# Patient Record
Sex: Female | Born: 1935 | Race: Black or African American | Hispanic: No | Smoking: Never smoker
Health system: Southern US, Community
[De-identification: ages and names within clinical notes are randomized; demographics above are authoritative.]

## PROBLEM LIST (undated history)

## (undated) DIAGNOSIS — E0590 Thyrotoxicosis, unspecified without thyrotoxic crisis or storm: Secondary | ICD-10-CM

## (undated) DIAGNOSIS — F0280 Dementia in other diseases classified elsewhere without behavioral disturbance: Secondary | ICD-10-CM

## (undated) DIAGNOSIS — G309 Alzheimer's disease, unspecified: Secondary | ICD-10-CM

## (undated) DIAGNOSIS — I10 Essential (primary) hypertension: Secondary | ICD-10-CM

---

## 2007-01-27 ENCOUNTER — Inpatient Hospital Stay (HOSPITAL_COMMUNITY): Admission: EM | Admit: 2007-01-27 | Discharge: 2007-01-30 | Payer: Self-pay | Admitting: Emergency Medicine

## 2007-01-27 ENCOUNTER — Encounter: Payer: Self-pay | Admitting: *Deleted

## 2008-07-14 ENCOUNTER — Emergency Department (HOSPITAL_COMMUNITY): Admission: EM | Admit: 2008-07-14 | Discharge: 2008-07-14 | Payer: Self-pay | Admitting: Emergency Medicine

## 2011-01-07 NOTE — Procedures (Signed)
EEG NUMBER:  [id number].   HISTORY:  This is an 77 year old patient who is being evaluated for  episodes of passing out, falling, decreased memory, the patient being  evaluated for possible seizures.  This is a routine EEG.  No skull  defects were noted.  Medications include Lovenox, Synthroid, glipizide  amlodipine.   EEG CLASSIFICATION:  Normal awake and drowsy.   DESCRIPTION OF THE RECORDING:  The background rhythm of this recording  consists of a fairly well-modulated medium-amplitude alpha rhythm of 10  Hz that is reactive to eye opening and closure.  As the record begins,  the patient appears to be in the drowsy state with a 7 Hz background  slowing.  The patient never appears to clearly enter stage II sleep.  The patient spends much of the recording in what appears to be a drowsy  state but is alerted towards the end of the recording in appears to have  a very well-modulated 10 Hz background activity.  Photic stimulation is  performed resulting in a bilateral and symmetric photic drive response.  Hyperventilation was not performed.  At no time during the recording  does there appear to be evidence of spikes, spike wave discharges, ,or  evidence of focal slowing.  The EKG monitor shows evidence of occasional  premature ventricular complexes with a heart rate of 78.   IMPRESSION:  This is an essentially normal EEG recording, awake and  drowsy state.  No evidence of ictal or interictal discharges was seen.      Marlan Palau, M.D.  Electronically Signed     [id number]  D:  01/29/2007 17:15:24  T:  01/30/2007 07:15:58  Job #:  [id number]

## 2011-01-10 NOTE — Discharge Summary (Signed)
NAME:  Michele Ward, FEIN NO.:  [id number]   MEDICAL RECORD NO.:  [id number]          PATIENT TYPE:  INP   LOCATION:  2035                         FACILITY:  [facility]   PHYSICIAN:  Margaretmary Bayley, M.D.    DATE OF BIRTH:  [birth year]   DATE OF ADMISSION:  01/27/2007  DATE OF DISCHARGE:  01/30/2007                               DISCHARGE SUMMARY   DISCHARGE DIAGNOSES:  1. Syncope.  2. Primary hyperparathyroidism.  3. Alcohol intoxication.  4. Contusions of the head, chest and posttraumatic headache.  5. Type 2 diabetes mellitus.  6. Lumbar spinal stenosis.   REASON FOR ADMISSION:  Michele Ward is an 77 year old hypertensive  type 2 diabetic who was brought into the emergency room after being  found by her son lying on the floor with an estimated period of time of  16 to 24 hours.  Patient was brought in through the emergency room where  she complained of some headache, lower back and right hip pain.  She was  evaluated in the ER, found to have no acute fractures, but did have  reduction in her ability to walk and was admitted for further  observation.   PERTINENT PHYSICAL FINDINGS:  GENERAL:  She is a well-developed-  appearing woman looking about her stated age.  There was no acute  distress noted.  LUNGS:  Her respirations were unlabored.  VITAL SIGNS:  Her admission blood pressure was 110/82 with pulse rate of  76 in a lying position and sitting with her legs dangling blood pressure  was 107/75 with a pulse rate of 80, respiratory rate of 20, temperature  is 97.  SKIN:  Warm and dry.  No gross ecchymoses was noted.  NECK:  Range of motion of her neck is full.  Movement of her extremities  are likewise full.  HEENT:  Remarkable for a negative Battle sign, negative racoon sign,  some broader evidence of head trauma.  Sclera is anicteric.  No  conjunctival pallor.  CHEST:  She has mild right rib tenderness, but no ecchymoses was noted.  LUNGS:  Clear to  percussion and auscultation.  CARDIAC EXAM:  Rhythm was basically regular with a few ventral systoles.  No gallops or rubs.  ABDOMEN:  Is not distended.  Soft.  No tenderness.  EXTREMITIES:  No clubbing, cyanosis or edema.  No calf tenderness.  Negative Homans sign.  BACK:  There is no punch tenderness over the spine.  No CVA tenderness.  NEUROLOGICALLY:  She was alert, oriented to name and place.   PERTINENT LAB AND X-RAY DATA:  Her EKG revealed a normal sinus rhythm,  no deviation of the electrical axis, nonspecific repolarization  abnormality.   White blood cell count of 10,100, sed rate 14, INR 1.0, PTT of 22.  CPEP  is remarkable for a potassium low at 3.3, sodium 143, glucose 132,  creatinine 0.73, calcium 2.6, free T4 is normal at 1.21, TSH 0.774 and a  B12 level that is low at 126.   Chest x-ray showed no acute process.   CT of the brain scan likewise showed no acute traumatic or nontraumatic  pathology.   HOSPITAL COURSE:  The patient was admitted to a telemetry bed where  there was no evidence found of headache, suggestion of a significant  arrhythmia.  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  In addition to parenteral  replacement with vitamin B12, patient was given a loading and  maintenance dose of thiamine.   In addition to her negative CT brain scan, an EEG was requested and a  verbal report prior to her discharge indicated that there was no  evidence suggestive of a seizure focus.  In addition to her thiamine and  B12 replacement, potassium replacement therapy, she was also continued  on levothyroxine at 50 mcg per day.  Although her urinalysis was  consistent with a cystitis, her subsequent urine cultures revealed a  variable amount of bacteria, but most consistent with an improper  collection.  A repeat urine culture will be obtained as an outpatient.   With the assistance of the physical therapy  division, the patient was  gradually ambulated and showed fairly good balance, but it was  recommended that she continue to use a walker as her physical therapy is  transitioned from inpatient to outpatient.  However, no significant  medical problems were noted during hospitalization, her blood sugars  remained quite good, and there was no evidence to suggest that  hypoglycemia played a role in her syncopal episode.   DISCHARGE CONDITION:  Is considered to be fair.   She continued on:  1. Synthroid at 50 mcg per day  2. Glipizide 2.5 mg daily.   Antihypertensive medication included:  1. Amlodipine 5 mg per day.  2. Valsartan 160 mg per day.  3. Ecotrin at 81 mg daily.  4. Ciprofloxacin at 500 mg b.i.d. for six days pending the completion      of a repeat urine culture.   She is discharged on a 3-gm sodium, no-concentrated sweets, modified and  restricted diet, and she is scheduled to be seen in followup in two  weeks.           ______________________________  Margaretmary Bayley, M.D.     PC/MEDQ  D:  03/18/2007  T:  03/18/2007  Job:  [id number]

## 2011-05-27 LAB — URINALYSIS, ROUTINE W REFLEX MICROSCOPIC
Bilirubin Urine: NEGATIVE
Glucose, UA: NEGATIVE
Nitrite: NEGATIVE
Protein, ur: NEGATIVE
Urobilinogen, UA: 0.2
pH: 6.5

## 2011-05-27 LAB — POCT I-STAT, CHEM 8
Chloride: 108
HCT: 34 — ABNORMAL LOW
Hemoglobin: 11.6 — ABNORMAL LOW
Potassium: 3.8
Sodium: 140
TCO2: 22

## 2011-05-27 LAB — URINE MICROSCOPIC-ADD ON

## 2011-05-27 LAB — CBC
HCT: 33.5 — ABNORMAL LOW
Hemoglobin: 11.3 — ABNORMAL LOW
MCV: 94.6
WBC: 4.6

## 2011-05-27 LAB — DIFFERENTIAL
Basophils Relative: 0
Monocytes Relative: 14 — ABNORMAL HIGH
Neutro Abs: 2.6

## 2011-05-27 LAB — GLUCOSE, CAPILLARY: Glucose-Capillary: 78

## 2011-06-12 LAB — COMPREHENSIVE METABOLIC PANEL
Alkaline Phosphatase: 79
CO2: 25
Chloride: 107
Creatinine, Ser: 0.73
GFR calc non Af Amer: >60
Potassium: 3.5
Sodium: 143
Total Bilirubin: 1.1

## 2011-06-12 LAB — BASIC METABOLIC PANEL
BUN: 18
BUN: 20
CO2: 26
CO2: 27
Calcium: 10
Chloride: 111
Creatinine, Ser: 0.78
Creatinine, Ser: 0.83
GFR calc Af Amer: >60
GFR calc non Af Amer: >60
Glucose, Bld: 128 — ABNORMAL HIGH
Sodium: 138

## 2011-06-12 LAB — CBC
Hemoglobin: 12.1
MCHC: 33.5
MCHC: 33.8
MCHC: 34.2
MCV: 91.3
MCV: 91.4
Platelets: 241
RBC: 3.83 — ABNORMAL LOW
RBC: 4.14
RBC: 4.56
RDW: 14.1 — ABNORMAL HIGH
WBC: 10.9 — ABNORMAL HIGH
WBC: 10.9 — ABNORMAL HIGH

## 2011-06-12 LAB — DIFFERENTIAL
Lymphs Abs: 1
Monocytes Absolute: 0.8 — ABNORMAL HIGH
Monocytes Relative: 7
Neutrophils Relative %: 83 — ABNORMAL HIGH

## 2011-06-12 LAB — RPR TITER: RPR Titer: 1:1 {titer}

## 2011-06-12 LAB — RPR: RPR Ser Ql: REACTIVE — AB

## 2011-06-12 LAB — ANTI-PARIETAL ANTIBODY: Parietal Cell Antibody-IgG: 1:80 {titer} — AB

## 2011-06-12 LAB — URINALYSIS, ROUTINE W REFLEX MICROSCOPIC
Glucose, UA: NEGATIVE
Nitrite: NEGATIVE
Urobilinogen, UA: 0.2

## 2011-06-12 LAB — T4, FREE: Free T4: 1.21

## 2011-06-12 LAB — URINALYSIS, MICROSCOPIC ONLY
Bilirubin Urine: NEGATIVE
Glucose, UA: NEGATIVE
Hgb urine dipstick: NEGATIVE
Ketones, ur: NEGATIVE
Nitrite: NEGATIVE
Specific Gravity, Urine: 1.016 (ref 1.005–1.035)
pH: 5.5

## 2011-06-12 LAB — URINE CULTURE: Special Requests: POSITIVE

## 2011-06-12 LAB — VITAMIN B12: Vitamin B-12: 126 — ABNORMAL LOW (ref 211–911)

## 2011-06-12 LAB — POCT I-STAT CREATININE: Operator id: 208821

## 2011-06-12 LAB — I-STAT 8, (EC8 V) (CONVERTED LAB)
BUN: 11
Bicarbonate: 25.6 — ABNORMAL HIGH
Chloride: 109
Glucose, Bld: 132 — ABNORMAL HIGH
Operator id: 208821
Potassium: 3.3 — ABNORMAL LOW

## 2011-06-12 LAB — TSH: TSH: 0.774

## 2011-06-12 LAB — SEDIMENTATION RATE: Sed Rate: 14

## 2011-06-12 LAB — PROTIME-INR
INR: 1
Prothrombin Time: 13.7

## 2011-06-12 LAB — URINE MICROSCOPIC-ADD ON

## 2011-06-12 LAB — APTT: aPTT: 22 — ABNORMAL LOW

## 2011-06-20 LAB — TPPA: Treponema Confirm: REACTIVE — AB

## 2011-08-05 ENCOUNTER — Emergency Department (HOSPITAL_COMMUNITY)
Admission: EM | Admit: 2011-08-05 | Discharge: 2011-08-05 | Disposition: A | Discharge status: Home / Self Care | Payer: Medicare Other | Attending: Emergency Medicine | Admitting: Emergency Medicine

## 2011-08-05 DIAGNOSIS — Z79899 Other long term (current) drug therapy: Secondary | ICD-10-CM | POA: Insufficient documentation

## 2011-08-05 DIAGNOSIS — F0390 Unspecified dementia without behavioral disturbance: Secondary | ICD-10-CM | POA: Insufficient documentation

## 2011-08-05 DIAGNOSIS — F515 Nightmare disorder: Secondary | ICD-10-CM

## 2011-08-05 DIAGNOSIS — IMO0002 Reserved for concepts with insufficient information to code with codable children: Secondary | ICD-10-CM | POA: Insufficient documentation

## 2011-08-05 DIAGNOSIS — R4182 Altered mental status, unspecified: Secondary | ICD-10-CM | POA: Insufficient documentation

## 2011-08-05 LAB — URINALYSIS, ROUTINE W REFLEX MICROSCOPIC
Bilirubin Urine: NEGATIVE
Glucose, UA: NEGATIVE mg/dL
Ketones, ur: NEGATIVE mg/dL
Nitrite: NEGATIVE
pH: 7.5 (ref 5.0–8.0)

## 2011-08-05 LAB — URINE MICROSCOPIC-ADD ON

## 2011-08-05 NOTE — ED Notes (Signed)
Patient very pleasant but orientated only to self.  Patient unable to answer any questions regarding age, living conditions  Or who takes care of her.

## 2011-08-05 NOTE — ED Notes (Signed)
PATIENT daughter calls again asking if the patient is stabilized.  Told the daughter that the patient has never been unstable since her arrival. patient has remained quiet and cooperative .  Dr Jerelyn Charles talks with the daughter and will d/c to home via [facility]

## 2011-08-05 NOTE — ED Notes (Signed)
Daughter called and talked to the tech.  Daughter told tech that she was coming to the hospital

## 2011-08-05 NOTE — ED Notes (Signed)
Per EMS patient family sts that the patient has "never" acted like this before.  Patient has Hx of altzheimers ds x 5years.  Family sts that the patient needs to be stablized.

## 2011-08-05 NOTE — ED Notes (Signed)
[facility] here to pick up patient . D/c'd home

## 2011-08-05 NOTE — ED Provider Notes (Signed)
History     CSN: [id number] Arrival date & time: 08/05/2011  3:10 AM   First MD Initiated Contact with Patient 08/05/11 [phone]      Chief Complaint  Patient presents with  . Altered Mental Status    (Consider location/radiation/quality/duration/timing/severity/associated sxs/prior treatment) Patient is a 77 y.o. female presenting with altered mental status. The history is provided by the EMS personnel. History Limited By: Dementia.  Altered Mental Status   the patient was brought from home where she stays with her family by EMS.  EMS reports the family called because the patient was acting out at home.  As to the details of this I have no further information.  The patient has a history of dementia and has so for 5 years.  She is on Namenda.  The patient is calm and cooperative in the emergency room and has no complaints.  The only contact number for the family in the computer is not active.  My staff and has made that the family has colds and states they will be in the emergency department shortly for which I hope to gain additional information.  No past medical history on file.  No past surgical history on file.  No family history on file.  History  Substance Use Topics  . Smoking status: Not on file  . Smokeless tobacco: Not on file  . Alcohol Use: Not on file    OB History    No data available      Review of Systems  Unable to perform ROS Psychiatric/Behavioral: Positive for altered mental status.    Allergies  Review of patient's allergies indicates not on file.  Home Medications   Current Outpatient Rx  Name Route Sig Dispense Refill  . AMLODIPINE BESY-BENAZEPRIL HCL 10-20 MG PO CAPS Oral Take 1 capsule by mouth daily.      . ASPIRIN 81 MG PO CHEW Oral Chew 81 mg by mouth daily.      . BUSPIRONE HCL 15 MG PO TABS Oral Take 15 mg by mouth 2 (two) times daily.      Marland Kitchen CALTRATE 600+D PLUS 600-400 MG-UNIT PO TABS Oral Take 1 tablet by mouth daily.      Marland Kitchen  LEVOTHYROXINE SODIUM 50 MCG PO TABS Oral Take 50 mcg by mouth daily.      Marland Kitchen MEMANTINE HCL 10 MG PO TABS Oral Take 10 mg by mouth 2 (two) times daily. Takes 1 tablet in the morning and 5mg  every evening     . NATEGLINIDE 120 MG PO TABS Oral Take 120 mg by mouth daily.        BP 131/67  Pulse 111  Temp(Src) 97.9 F (36.6 C) (Oral)  Resp 20  SpO2 95%  Physical Exam  Nursing note and vitals reviewed. Constitutional: She appears well-developed and well-nourished. No distress.  HENT:  Head: Normocephalic and atraumatic.  Eyes: EOM are normal.  Neck: Normal range of motion.  Cardiovascular: Normal rate, regular rhythm and normal heart sounds.   Pulmonary/Chest: Effort normal and breath sounds normal.  Abdominal: Soft. She exhibits no distension. There is no tenderness.  Musculoskeletal: Normal range of motion.  Neurological: She is alert.  Skin: Skin is warm and dry.  Psychiatric: She has a normal mood and affect. Judgment normal.    ED Course  Procedures (including critical care time)   Labs Reviewed  CBC  BASIC METABOLIC PANEL  URINALYSIS, ROUTINE W REFLEX MICROSCOPIC   No results found.   1. Nightmare  MDM  At this time we'll obtain basic blood work and a urine sample and await the family's arrival for additional information to help direct her care in the emergency department today   5:36 AM The family has called and reports that the patient awoke screaming in her bed yelling that she was following this disturbed the family and assisted her to the ER for evaluation.  I suspect the patient had a nightmare.  The family agrees that also thinks he had a nightmare but they said they were concerned and wanted are out input and evaluation  Pt will be discharged back home  Lyanne Co, MD 08/05/11 [phone]

## 2011-12-02 ENCOUNTER — Encounter (HOSPITAL_COMMUNITY): Payer: Self-pay | Admitting: *Deleted

## 2011-12-02 ENCOUNTER — Emergency Department (HOSPITAL_COMMUNITY): Payer: Medicare Other

## 2011-12-02 ENCOUNTER — Emergency Department (HOSPITAL_COMMUNITY)
Admission: EM | Admit: 2011-12-02 | Discharge: 2011-12-02 | Disposition: A | Discharge status: Home / Self Care | Payer: Medicare Other | Attending: Emergency Medicine | Admitting: Emergency Medicine

## 2011-12-02 DIAGNOSIS — E039 Hypothyroidism, unspecified: Secondary | ICD-10-CM | POA: Insufficient documentation

## 2011-12-02 DIAGNOSIS — Z79899 Other long term (current) drug therapy: Secondary | ICD-10-CM | POA: Insufficient documentation

## 2011-12-02 DIAGNOSIS — E119 Type 2 diabetes mellitus without complications: Secondary | ICD-10-CM | POA: Insufficient documentation

## 2011-12-02 DIAGNOSIS — G309 Alzheimer's disease, unspecified: Secondary | ICD-10-CM | POA: Insufficient documentation

## 2011-12-02 DIAGNOSIS — I10 Essential (primary) hypertension: Secondary | ICD-10-CM | POA: Insufficient documentation

## 2011-12-02 DIAGNOSIS — F0280 Dementia in other diseases classified elsewhere without behavioral disturbance: Secondary | ICD-10-CM | POA: Insufficient documentation

## 2011-12-02 DIAGNOSIS — R634 Abnormal weight loss: Secondary | ICD-10-CM | POA: Insufficient documentation

## 2011-12-02 DIAGNOSIS — Z7901 Long term (current) use of anticoagulants: Secondary | ICD-10-CM | POA: Insufficient documentation

## 2011-12-02 HISTORY — DX: Thyrotoxicosis, unspecified without thyrotoxic crisis or storm: E05.90

## 2011-12-02 HISTORY — DX: Alzheimer's disease, unspecified: G30.9

## 2011-12-02 HISTORY — DX: Dementia in other diseases classified elsewhere, unspecified severity, without behavioral disturbance, psychotic disturbance, mood disturbance, and anxiety: F02.80

## 2011-12-02 HISTORY — DX: Essential (primary) hypertension: I10

## 2011-12-02 LAB — COMPREHENSIVE METABOLIC PANEL
ALT: 18 U/L (ref 0–35)
Alkaline Phosphatase: 123 U/L — ABNORMAL HIGH (ref 39–117)
BUN: 9 mg/dL (ref 6–23)
Chloride: 103 mEq/L (ref 96–112)
GFR calc Af Amer: >90 mL/min (ref >90)
Glucose, Bld: 166 mg/dL — ABNORMAL HIGH (ref 70–99)
Potassium: 3.7 mEq/L (ref 3.5–5.1)
Total Bilirubin: 0.3 mg/dL (ref 0.3–1.2)

## 2011-12-02 LAB — URINALYSIS, ROUTINE W REFLEX MICROSCOPIC
Bilirubin Urine: NEGATIVE
Glucose, UA: NEGATIVE mg/dL
Hgb urine dipstick: NEGATIVE
Protein, ur: 30 mg/dL — AB
Specific Gravity, Urine: 1.011 (ref 1.005–1.030)
Urobilinogen, UA: 0.2 mg/dL (ref 0.0–1.0)

## 2011-12-02 LAB — DIFFERENTIAL
Lymphocytes Relative: 14 % (ref 12–46)
Lymphs Abs: 0.8 10*3/uL (ref 0.7–4.0)
Monocytes Relative: 10 % (ref 3–12)
Neutro Abs: 4 10*3/uL (ref 1.7–7.7)
Neutrophils Relative %: 75 % (ref 43–77)

## 2011-12-02 LAB — CBC
Hemoglobin: 11.8 g/dL — ABNORMAL LOW (ref 12.0–15.0)
RBC: 3.81 MIL/uL — ABNORMAL LOW (ref 3.87–5.11)
WBC: 5.3 10*3/uL (ref 4.0–10.5)

## 2011-12-02 LAB — PROTIME-INR: Prothrombin Time: 12.6 seconds (ref 11.6–15.2)

## 2011-12-02 LAB — URINE MICROSCOPIC-ADD ON

## 2011-12-02 MED ORDER — NATEGLINIDE 120 MG PO TABS
120.0000 mg | ORAL_TABLET | Freq: Every day | ORAL | Status: DC
  Filled 2011-12-02: qty 1

## 2011-12-02 MED ORDER — SODIUM CHLORIDE 0.9 % IV SOLN
1000.0000 mL | INTRAVENOUS | Status: DC
  Administered 2011-12-02: 1000 mL via INTRAVENOUS

## 2011-12-02 MED ORDER — SODIUM CHLORIDE 0.9 % IV SOLN: 1000.0000 mL | Freq: Once | INTRAVENOUS | Status: DC

## 2011-12-02 MED ORDER — BUSPIRONE HCL 10 MG PO TABS: 15.0000 mg | ORAL_TABLET | Freq: Two times a day (BID) | ORAL | Status: DC

## 2011-12-02 MED ORDER — MEMANTINE HCL 10 MG PO TABS
10.0000 mg | ORAL_TABLET | Freq: Every day | ORAL | Status: DC
Start: 2011-12-03 — End: 2011-12-02

## 2011-12-02 MED ORDER — MEMANTINE HCL 5 MG PO TABS
5.0000 mg | ORAL_TABLET | Freq: Every day | ORAL | Status: DC
  Filled 2011-12-02: qty 1

## 2011-12-02 MED ORDER — LEVOTHYROXINE SODIUM 50 MCG PO TABS
50.0000 ug | ORAL_TABLET | Freq: Every day | ORAL | Status: DC
  Filled 2011-12-02: qty 1

## 2011-12-02 MED ORDER — MEMANTINE HCL 5 MG PO TABS: 5.0000 mg | ORAL_TABLET | ORAL | Status: DC

## 2011-12-02 NOTE — Discharge Instructions (Signed)
Alzheimer's Disease (AD), Diagnosis There are many conditions that can impair thinking and memory. There is no one specific test or group of tests for diagnosing Alzheimer's. Instead, the disease is diagnosed by symptoms, tests of the neurologic system, and results from diagnostic tests. These tests help exclude other conditions that might cause similar symptoms. When caregivers do detailed testing of thinking and memory skills, it may take several hours to complete. These tests can help detect Alzheimer's and other dementias at an early stage. Caregivers must make a diagnosis of "possible" or "probable" Alzheimer's disease. The evaluation may include:  A complete medical history, including information about:   The patient's general health.   Past medical problems.   Any difficulties the person has carrying out daily activities.   Medical tests, such as tests of:   Blood.   Urine.   Spinal fluid.  These help the caregiver find other possible diseases causing the symptoms.   Neuropsychological tests to measure:   Memory.   Problem solving.   Attention.   Counting.   Language.   Brain scans allow the caregiver to look at a picture of the brain to see if anything does not look normal.   Information from the medical history and test results help the caregiver to rule out other possible causes of the patient's symptoms. For example, the following conditions can cause Alzheimer's-like symptoms:   Thyroid problems.   Drug reactions.Depression.   Brain tumors.   Blood vessel disease in the brain.  Some of these other conditions can be treated successfully. WHAT IS THE OUTLOOK FOR SOMEONE DIAGNOSED WITH AD? The course the disease takes and how quickly changes happen vary from person to person. Lifespan of the disease depends on how old the patient is when diagnosed. But the disease can last for as many as 20 years. WHY IS EARLY DIAGNOSIS IMPORTANT? An early, accurate diagnosis  of AD helps patients and their families plan for the future. It gives them time to discuss care options while the patient can still take part in making decisions. Early diagnosis also offers the best chance to treat the symptoms of the disease. Document Released: 04/22/2004 Document Revised: 07/31/2011 Document Reviewed: 11/10/2008 [facility] [facility] Patient Information 2012 [place], [place].

## 2011-12-02 NOTE — ED Provider Notes (Signed)
History     CSN: [id number]  Arrival date & time 12/02/11  1235   First MD Initiated Contact with Patient 12/02/11 1500      Chief Complaint  Patient presents with  . Agitation  . Weight Loss    HPI Pt was sent in from her Doctor's office for possible elevated temperature and elevated blood pressure.  She has also been having trouble with weight loss.  Pt went in today for a regular appointment.   Pt has had trouble with her weight for the last 5 years associated with her thyroid problems.   No known fevers.  No pain.  She was anxious this morning however.  Pt denies any complaints now except that she needs to urinate.  Pt is hungry now eating a hamburger that family brought in.   At the doctor's office she had a temperature of 99.6 and her systolic blood pressure was 195.  She was sent in for evaluation for weight loss, ? Dehydratopm. Rule out infection. Past Medical History  Diagnosis Date  . Hypertension   . Diabetes mellitus   . Alzheimer's dementia   . Hyperthyroidism     History reviewed. No pertinent past surgical history.  History reviewed. No pertinent family history.  History  Substance Use Topics  . Smoking status: Never Smoker   . Smokeless tobacco: Not on file  . Alcohol Use: No    OB History    Grav Para Term Preterm Abortions TAB SAB Ect Mult Living                  Review of Systems  Constitutional: Negative for fever.  Respiratory: Negative for chest tightness.   Cardiovascular: Negative for chest pain.  Genitourinary: Negative for dysuria.  Musculoskeletal: Negative for back pain.  Neurological: Negative for seizures.  Psychiatric/Behavioral: Positive for confusion.  All other systems reviewed and are negative.    Allergies  Review of patient's allergies indicates no known allergies.  Home Medications   Current Outpatient Rx  Name Route Sig Dispense Refill  . AMLODIPINE BESY-BENAZEPRIL HCL 10-20 MG PO CAPS Oral Take 1 capsule by mouth  daily.      . ASPIRIN 81 MG PO CHEW Oral Chew 81 mg by mouth daily.      . BUSPIRONE HCL 15 MG PO TABS Oral Take 15 mg by mouth 2 (two) times daily.      Marland Kitchen CALTRATE 600+D PLUS 600-400 MG-UNIT PO TABS Oral Take 1 tablet by mouth daily.      Marland Kitchen LEVOTHYROXINE SODIUM 50 MCG PO TABS Oral Take 50 mcg by mouth daily.     Marland Kitchen MEMANTINE HCL 10 MG PO TABS Oral Take 5-10 mg by mouth See admin instructions. Takes 1 tablet in the morning and 5mg  every evening    . ADULT MULTIVITAMIN W/MINERALS CH Oral Take 1 tablet by mouth daily.    Marland Kitchen NATEGLINIDE 120 MG PO TABS Oral Take 120 mg by mouth daily.        BP 149/66  Pulse 110  Temp(Src) 98.9 F (37.2 C) (Oral)  Resp 20  Wt 115 lb (52.164 kg)  SpO2 97%  Physical Exam  Nursing note and vitals reviewed. Constitutional: No distress.       Alert, pleasant, eating her meal  HENT:  Head: Normocephalic and atraumatic.  Right Ear: External ear normal.  Left Ear: External ear normal.  Eyes: Conjunctivae are normal. Right eye exhibits no discharge. Left eye exhibits no discharge. No scleral icterus.  Neck: Neck supple. No tracheal deviation present.  Cardiovascular: Normal rate, regular rhythm and intact distal pulses.   Pulmonary/Chest: Effort normal and breath sounds normal. No stridor. No respiratory distress. She has no wheezes. She has no rales.  Abdominal: Soft. Bowel sounds are normal. She exhibits no distension. There is no tenderness. There is no rebound and no guarding.  Musculoskeletal: She exhibits no edema and no tenderness.  Neurological: She is alert. She has normal strength. No sensory deficit. Cranial nerve deficit:  no gross defecits noted. She exhibits normal muscle tone. She displays no seizure activity. Coordination normal.  Skin: Skin is warm and dry. No rash noted. She is not diaphoretic.  Psychiatric: She has a normal mood and affect.    ED Course  Procedures (including critical care time)  Labs Reviewed  CBC - Abnormal; Notable for  the following:    RBC 3.81 (*)    Hemoglobin 11.8 (*)    HCT 34.5 (*)    All other components within normal limits  COMPREHENSIVE METABOLIC PANEL - Abnormal; Notable for the following:    Glucose, Bld 166 (*)    Alkaline Phosphatase 123 (*)    GFR calc non Af Amer 80 (*)    All other components within normal limits  URINALYSIS, ROUTINE W REFLEX MICROSCOPIC - Abnormal; Notable for the following:    Protein, ur 30 (*)    All other components within normal limits  DIFFERENTIAL  PROTIME-INR  URINE MICROSCOPIC-ADD ON   Dg Chest 2 View  12/02/2011  *RADIOLOGY REPORT*  Clinical Data: .  Weight loss.  Confusion.  Weakness.  CHEST - 2 VIEW  Comparison: None.  Findings: Lateral view degraded by patient positioning.  Severe osteopenia.  Frontal views also degraded by patient positioning. Severe degenerative or post-traumatic deformity of the left glenohumeral joint.  Patient rotated to the right.  Moderate cardiomegaly with tortuous thoracic aorta.  No definite pleural fluid. No pneumothorax.  Bibasilar volume loss with interstitial thickening. No lobar consolidation.  IMPRESSION: Although both views are degraded by patient positioning, no acute finding. Cardiomegaly without congestive failure.  Original Report Authenticated By: Consuello Bossier, M.D.   Ct Head Wo Contrast  12/02/2011  *RADIOLOGY REPORT*  Clinical Data: Weight loss, altered mental status  CT HEAD WITHOUT CONTRAST  Technique:  Contiguous axial images were obtained from the base of the skull through the vertex without contrast.  Comparison: 07/14/2008  Findings: Cardiomediastinal silhouette is stable.  There are motion artifacts.  Hyperostosis frontalis interna again noted.  Mild mucosal thickening noted left sphenoid sinus.  No definite acute cortical infarction.  No mass lesion is noted on this unenhanced scan.  Stable periventricular and subcortical chronic white matter disease.  IMPRESSION:  No acute intracranial abnormality.  Stable atrophy  and chronic white matter disease.  No definite acute cortical infarction.  Mild mucosal thickening noted left sphenoid sinus.  Original Report Authenticated By: Natasha Mead, M.D.     1. Alzheimer disease       MDM  The patient has been accompanied by her caregiver as well as her daughter. She is alert and at her baseline. She ate a meal without any difficulty. At this time there does not appear to be any evidence of acute infection. She does not appear to be dehydrated. There is no evidence of acute stroke or intracerebral hemorrhage. It is possible that her agitation earlier was related to her Alzheimer's. Her caregiver states that generally the patient is more active at night  and does not do as well in the daytime. She will be discharged home. The caregiver and patient are comfortable with this plan       Celene Kras, MD 12/02/11 1757

## 2011-12-02 NOTE — ED Notes (Signed)
Per pt's family pt has started to have more anxious and decreased po intake. Pt went to pcp and was sent over here

## 2011-12-16 ENCOUNTER — Other Ambulatory Visit: Payer: Self-pay | Admitting: Internal Medicine

## 2011-12-16 DIAGNOSIS — N3289 Other specified disorders of bladder: Secondary | ICD-10-CM

## 2011-12-17 ENCOUNTER — Ambulatory Visit (HOSPITAL_COMMUNITY)
Admission: RE | Admit: 2011-12-17 | Discharge: 2011-12-17 | Disposition: A | Discharge status: Home / Self Care | Payer: Medicare Other | Source: Ambulatory Visit | Attending: Internal Medicine | Admitting: Internal Medicine

## 2011-12-17 ENCOUNTER — Emergency Department (HOSPITAL_COMMUNITY)
Admission: EM | Admit: 2011-12-17 | Discharge: 2011-12-17 | Disposition: A | Discharge status: Home / Self Care | Payer: Medicare Other | Attending: Emergency Medicine | Admitting: Emergency Medicine

## 2011-12-17 ENCOUNTER — Encounter (HOSPITAL_COMMUNITY): Payer: Self-pay | Admitting: *Deleted

## 2011-12-17 ENCOUNTER — Other Ambulatory Visit: Payer: Self-pay | Admitting: Internal Medicine

## 2011-12-17 DIAGNOSIS — E119 Type 2 diabetes mellitus without complications: Secondary | ICD-10-CM | POA: Insufficient documentation

## 2011-12-17 DIAGNOSIS — I10 Essential (primary) hypertension: Secondary | ICD-10-CM | POA: Insufficient documentation

## 2011-12-17 DIAGNOSIS — E0590 Thyrotoxicosis, unspecified without thyrotoxic crisis or storm: Secondary | ICD-10-CM | POA: Insufficient documentation

## 2011-12-17 DIAGNOSIS — N3289 Other specified disorders of bladder: Secondary | ICD-10-CM | POA: Insufficient documentation

## 2011-12-17 DIAGNOSIS — Z79899 Other long term (current) drug therapy: Secondary | ICD-10-CM | POA: Insufficient documentation

## 2011-12-17 DIAGNOSIS — Z7982 Long term (current) use of aspirin: Secondary | ICD-10-CM | POA: Insufficient documentation

## 2011-12-17 DIAGNOSIS — R339 Retention of urine, unspecified: Secondary | ICD-10-CM | POA: Insufficient documentation

## 2011-12-17 DIAGNOSIS — F0280 Dementia in other diseases classified elsewhere without behavioral disturbance: Secondary | ICD-10-CM | POA: Insufficient documentation

## 2011-12-17 DIAGNOSIS — G309 Alzheimer's disease, unspecified: Secondary | ICD-10-CM | POA: Insufficient documentation

## 2011-12-17 DIAGNOSIS — R300 Dysuria: Secondary | ICD-10-CM | POA: Insufficient documentation

## 2011-12-17 DIAGNOSIS — N1330 Unspecified hydronephrosis: Secondary | ICD-10-CM | POA: Insufficient documentation

## 2011-12-17 LAB — URINE MICROSCOPIC-ADD ON

## 2011-12-17 LAB — POCT I-STAT, CHEM 8
BUN: 12 mg/dL (ref 6–23)
HCT: 29 % — ABNORMAL LOW (ref 36.0–46.0)
Hemoglobin: 9.9 g/dL — ABNORMAL LOW (ref 12.0–15.0)
Sodium: 142 mEq/L (ref 135–145)
TCO2: 20 mmol/L (ref 0–100)

## 2011-12-17 LAB — URINALYSIS, ROUTINE W REFLEX MICROSCOPIC
Bilirubin Urine: NEGATIVE
Nitrite: NEGATIVE
Protein, ur: NEGATIVE mg/dL
Specific Gravity, Urine: 1.01 (ref 1.005–1.030)
Urobilinogen, UA: 0.2 mg/dL (ref 0.0–1.0)

## 2011-12-17 MED ORDER — MEMANTINE HCL 5 MG PO TABS: 5.0000 mg | ORAL_TABLET | ORAL | Status: DC

## 2011-12-17 MED ORDER — CEPHALEXIN 500 MG PO CAPS: 500.0000 mg | ORAL_CAPSULE | Freq: Four times a day (QID) | ORAL | Status: AC

## 2011-12-17 MED ORDER — MEMANTINE HCL 10 MG PO TABS
10.0000 mg | ORAL_TABLET | Freq: Every day | ORAL | Status: DC
Start: 2011-12-18 — End: 2011-12-17

## 2011-12-17 MED ORDER — MEMANTINE HCL 5 MG PO TABS
5.0000 mg | ORAL_TABLET | Freq: Every day | ORAL | Status: DC
  Filled 2011-12-17 (×2): qty 1

## 2011-12-17 NOTE — Discharge Instructions (Signed)
Acute Urinary Retention, Female Urinary retention means you are unable to pee completely or at all (empty your bladder). HOME CARE  Drink enough fluids to keep your pee (urine) clear or pale yellow.   If you are sent home with a tube that drains the bladder (catheter), there will be a drainage bag attached to it.   Keep the drainage bag emptied.   Keep the drainage bag lower than the tube.   Only take medicine as told by your doctor.  GET HELP RIGHT AWAY IF:   You have chills.   You have a temperature by mouth above 102 F (38.9 C), not controlled by medicine.   You feel sick.   You develop belly (abdominal) or back pain.  MAKE SURE YOU:   Understand these instructions.   Will watch your condition.   Will get help right away if you are not doing well or get worse.  Document Released: 01/28/2008 Document Revised: 07/31/2011 Document Reviewed: 07/13/2009 [employer] Patient Information 2012 [employer]. 

## 2011-12-17 NOTE — ED Notes (Signed)
Pt's daughter at bedside. States since pt was discharged from hospital beginning of April after catheter removal she has been incontinent of urine. States pcp ordered some meds that helped but when she ran out she began having same problems and they ordered an [place]. [place] sent pt over here due to urine backing up into kidneys. Pt has no complaints. NAD noted at this time.

## 2011-12-17 NOTE — ED Notes (Signed)
MD at bedside. 

## 2011-12-17 NOTE — ED Provider Notes (Signed)
History     CSN: [id number]  Arrival date & time 12/17/11  1337   First MD Initiated Contact with Patient 12/17/11 1516      Chief Complaint  Patient presents with  . Urinary Retention    pt brought to ED from [place], states that her "urine is backing up in her kidneys."    HPI Pt was seen by her PCP today for difficulties with urination.  She states this has been ongoing since being catheterized in the ED for a urinary specimen back on April 9th.  Pt was incidentally seen by me at that time.   During that visit the patient was evaluated for a possible fever and elevated blood pressure. Her evaluation in the emergency department was unremarkable. She did have a urinalysis that did not show evidence of urinary tract infection. She also had an electrolyte panel that showed normal renal function. The patient currently denies any complaints. She has excellent home care and has a caregiver that stays with her for her dementia. They have not noticed any other symptoms as far as fever, vomiting, diarrhea, cough or pain.  The patient was sent today for an outpatient ultrasound. Results so that she had urinary retention with hydronephrosis. Her PCP was contacted and he requested the patient be sent to the emergency room for evaluation. Past Medical History  Diagnosis Date  . Hypertension   . Diabetes mellitus   . Alzheimer's dementia   . Hyperthyroidism     History reviewed. No pertinent past surgical history.  History reviewed. No pertinent family history.  History  Substance Use Topics  . Smoking status: Never Smoker   . Smokeless tobacco: Not on file  . Alcohol Use: No    OB History    Grav Para Term Preterm Abortions TAB SAB Ect Mult Living                  Review of Systems  All other systems reviewed and are negative.    Allergies  Review of patient's allergies indicates no known allergies.  Home Medications   Current Outpatient Rx  Name Route Sig Dispense Refill  .  AMLODIPINE BESY-BENAZEPRIL HCL 10-20 MG PO CAPS Oral Take 1 capsule by mouth daily.      . ASPIRIN 81 MG PO CHEW Oral Chew 81 mg by mouth daily.      . BUSPIRONE HCL 15 MG PO TABS Oral Take 15 mg by mouth 2 (two) times daily.      Marland Kitchen CALTRATE 600+D PLUS 600-400 MG-UNIT PO TABS Oral Take 1 tablet by mouth daily.      Marland Kitchen LEVOTHYROXINE SODIUM 50 MCG PO TABS Oral Take 50 mcg by mouth daily.     Marland Kitchen MEMANTINE HCL 10 MG PO TABS Oral Take 5-10 mg by mouth See admin instructions. Takes 1 tablet in the morning and 5mg  every evening    . ADULT MULTIVITAMIN W/MINERALS CH Oral Take 1 tablet by mouth daily.    Marland Kitchen NATEGLINIDE 120 MG PO TABS Oral Take 120 mg by mouth daily.        BP 135/70  Pulse 88  Temp(Src) 97.9 F (36.6 C) (Oral)  Resp 15  SpO2 99%  Physical Exam  Nursing note and vitals reviewed. Constitutional: She appears well-developed and well-nourished. No distress.  HENT:  Head: Normocephalic and atraumatic.  Right Ear: External ear normal.  Left Ear: External ear normal.  Eyes: Conjunctivae are normal. Right eye exhibits no discharge. Left eye exhibits no  discharge. No scleral icterus.  Neck: Neck supple. No tracheal deviation present.  Cardiovascular: Normal rate, regular rhythm and normal heart sounds.   Pulmonary/Chest: Effort normal. No stridor. No respiratory distress. She has no wheezes.  Abdominal: She exhibits no distension. There is no tenderness. There is no rebound and no guarding.  Musculoskeletal: She exhibits no edema and no tenderness.  Neurological: She is alert. Cranial nerve deficit: no gross deficits.  Skin: Skin is warm and dry. No rash noted.  Psychiatric: She has a normal mood and affect.    ED Course  Procedures (including critical care time)  Labs Reviewed  URINALYSIS, ROUTINE W REFLEX MICROSCOPIC - Abnormal; Notable for the following:    Leukocytes, UA SMALL (*)    All other components within normal limits  POCT I-STAT, CHEM 8 - Abnormal; Notable for the  following:    Potassium 3.4 (*)    Glucose, Bld 167 (*)    Calcium, Ion 1.44 (*)    Hemoglobin 9.9 (*)    HCT 29.0 (*)    All other components within normal limits  URINE MICROSCOPIC-ADD ON  URINE CULTURE   US Renal  12/17/2011  *RADIOLOGY REPORT*  Clinical Data: 78 year old female with urinary tract infection, dysuria.  RENAL/URINARY TRACT ULTRASOUND COMPLETE  Comparison:  None.  Findings:  Right Kidney:  Mild to moderate hydronephrosis.  Renal length 9.3 cm.  Cortical echotexture within normal limits for age and no focal renal lesion.  Left Kidney:  Mild to moderate hydronephrosis.  Renal length 9.5 cm.  Cortical echotexture within normal limits for age and no focal renal lesion.  Bladder:  Severely distended.  Estimated pre void volume of 1730 ml.  The patient was asked to void.  Postvoid residual is 1642 ml. Some floating and dependent echogenic debris is noted.  Ureteral jets were identified by color Doppler.  IMPRESSION: 1.  Severe bladder distention, with little emptying of the bladder by voluntary voiding.  Echogenic debris probably related to acute or recent urinary tract infection. 2.  Bilateral hydronephrosis, probably on the basis of the bladder distention. Study discussed by telephone with Dr. Margaretmary Bayley on 12/17/2011 at 1325 hours.  He asked that we had transfer the patient from Radiology to the Emergency Department where he will try to arrange for evaluation by Urology.  Original Report Authenticated By: Harley Hallmark, M.D.     1. Urinary retention       MDM  Pt has possible UTI.  Urinary retention treated with foley catheter.  Good response to the catheter.  No sign of renal failure.  Anemia noted on i stat however pt not having any symptoms of GI bleeding, other distress.  Discussed findings with family.  Pt ready for discharge.  Will refer to urology.        Celene Kras, MD 12/17/11 [phone]

## 2011-12-17 NOTE — ED Notes (Signed)
Brandon, tech, at bedside for foley catheter insertion

## 2011-12-17 NOTE — ED Notes (Signed)
Patient did not take the Namenda 5 mg. Pt spit it in the cup and it dissolve. Ordered another Namenda 5 mg pill from pharmacy

## 2011-12-18 LAB — URINE CULTURE

## 2013-02-24 ENCOUNTER — Other Ambulatory Visit: Payer: Self-pay | Admitting: Internal Medicine

## 2013-02-24 DIAGNOSIS — R109 Unspecified abdominal pain: Secondary | ICD-10-CM

## 2013-02-24 DIAGNOSIS — R634 Abnormal weight loss: Secondary | ICD-10-CM

## 2013-02-24 DIAGNOSIS — R05 Cough: Secondary | ICD-10-CM

## 2013-03-03 ENCOUNTER — Ambulatory Visit (HOSPITAL_COMMUNITY)
Admission: RE | Admit: 2013-03-03 | Discharge: 2013-03-03 | Disposition: A | Discharge status: Home / Self Care | Payer: Medicare Other | Source: Ambulatory Visit | Attending: Internal Medicine | Admitting: Internal Medicine

## 2013-03-03 DIAGNOSIS — I700 Atherosclerosis of aorta: Secondary | ICD-10-CM | POA: Insufficient documentation

## 2013-03-03 DIAGNOSIS — R059 Cough, unspecified: Secondary | ICD-10-CM | POA: Insufficient documentation

## 2013-03-03 DIAGNOSIS — M899 Disorder of bone, unspecified: Secondary | ICD-10-CM | POA: Insufficient documentation

## 2013-03-03 DIAGNOSIS — I2510 Atherosclerotic heart disease of native coronary artery without angina pectoris: Secondary | ICD-10-CM | POA: Insufficient documentation

## 2013-03-03 DIAGNOSIS — S22009A Unspecified fracture of unspecified thoracic vertebra, initial encounter for closed fracture: Secondary | ICD-10-CM | POA: Insufficient documentation

## 2013-03-03 DIAGNOSIS — I708 Atherosclerosis of other arteries: Secondary | ICD-10-CM | POA: Insufficient documentation

## 2013-03-03 DIAGNOSIS — M949 Disorder of cartilage, unspecified: Secondary | ICD-10-CM | POA: Insufficient documentation

## 2013-03-03 DIAGNOSIS — R109 Unspecified abdominal pain: Secondary | ICD-10-CM | POA: Insufficient documentation

## 2013-03-03 DIAGNOSIS — R05 Cough: Secondary | ICD-10-CM

## 2013-03-03 DIAGNOSIS — X58XXXA Exposure to other specified factors, initial encounter: Secondary | ICD-10-CM | POA: Insufficient documentation

## 2013-03-03 DIAGNOSIS — R634 Abnormal weight loss: Secondary | ICD-10-CM | POA: Insufficient documentation

## 2013-03-03 MED ORDER — IOHEXOL 300 MG/ML  SOLN
100.0000 mL | Freq: Once | INTRAMUSCULAR | Status: AC | PRN
  Administered 2013-03-03: 100 mL via INTRAVENOUS

## 2013-04-17 ENCOUNTER — Inpatient Hospital Stay (HOSPITAL_COMMUNITY)
Admission: EM | Admit: 2013-04-17 | Discharge: 2013-04-22 | DRG: 592 | Disposition: A | Discharge status: Hospice | Payer: Medicare Other | Attending: Internal Medicine | Admitting: Internal Medicine

## 2013-04-17 ENCOUNTER — Emergency Department (HOSPITAL_COMMUNITY): Payer: Medicare Other

## 2013-04-17 ENCOUNTER — Encounter (HOSPITAL_COMMUNITY): Payer: Self-pay | Admitting: Cardiology

## 2013-04-17 DIAGNOSIS — Z79899 Other long term (current) drug therapy: Secondary | ICD-10-CM

## 2013-04-17 DIAGNOSIS — L8994 Pressure ulcer of unspecified site, stage 4: Secondary | ICD-10-CM | POA: Diagnosis present

## 2013-04-17 DIAGNOSIS — R6510 Systemic inflammatory response syndrome (SIRS) of non-infectious origin without acute organ dysfunction: Secondary | ICD-10-CM | POA: Diagnosis present

## 2013-04-17 DIAGNOSIS — R627 Adult failure to thrive: Secondary | ICD-10-CM | POA: Diagnosis present

## 2013-04-17 DIAGNOSIS — E870 Hyperosmolality and hypernatremia: Secondary | ICD-10-CM | POA: Diagnosis present

## 2013-04-17 DIAGNOSIS — L89209 Pressure ulcer of unspecified hip, unspecified stage: Secondary | ICD-10-CM | POA: Diagnosis present

## 2013-04-17 DIAGNOSIS — G309 Alzheimer's disease, unspecified: Secondary | ICD-10-CM | POA: Diagnosis present

## 2013-04-17 DIAGNOSIS — R64 Cachexia: Secondary | ICD-10-CM | POA: Diagnosis present

## 2013-04-17 DIAGNOSIS — Z681 Body mass index (BMI) 19 or less, adult: Secondary | ICD-10-CM

## 2013-04-17 DIAGNOSIS — R5381 Other malaise: Secondary | ICD-10-CM | POA: Diagnosis present

## 2013-04-17 DIAGNOSIS — D649 Anemia, unspecified: Secondary | ICD-10-CM

## 2013-04-17 DIAGNOSIS — F0390 Unspecified dementia without behavioral disturbance: Secondary | ICD-10-CM | POA: Diagnosis present

## 2013-04-17 DIAGNOSIS — E876 Hypokalemia: Secondary | ICD-10-CM | POA: Diagnosis not present

## 2013-04-17 DIAGNOSIS — R531 Weakness: Secondary | ICD-10-CM

## 2013-04-17 DIAGNOSIS — A4901 Methicillin susceptible Staphylococcus aureus infection, unspecified site: Secondary | ICD-10-CM | POA: Diagnosis present

## 2013-04-17 DIAGNOSIS — L8995 Pressure ulcer of unspecified site, unstageable: Secondary | ICD-10-CM | POA: Diagnosis present

## 2013-04-17 DIAGNOSIS — Z66 Do not resuscitate: Secondary | ICD-10-CM | POA: Diagnosis not present

## 2013-04-17 DIAGNOSIS — L8992 Pressure ulcer of unspecified site, stage 2: Secondary | ICD-10-CM | POA: Diagnosis present

## 2013-04-17 DIAGNOSIS — F0280 Dementia in other diseases classified elsewhere without behavioral disturbance: Secondary | ICD-10-CM | POA: Diagnosis present

## 2013-04-17 DIAGNOSIS — L8990 Pressure ulcer of unspecified site, unspecified stage: Secondary | ICD-10-CM | POA: Diagnosis present

## 2013-04-17 DIAGNOSIS — N390 Urinary tract infection, site not specified: Secondary | ICD-10-CM | POA: Diagnosis present

## 2013-04-17 DIAGNOSIS — L89154 Pressure ulcer of sacral region, stage 4: Secondary | ICD-10-CM

## 2013-04-17 DIAGNOSIS — I10 Essential (primary) hypertension: Secondary | ICD-10-CM | POA: Diagnosis present

## 2013-04-17 DIAGNOSIS — L89109 Pressure ulcer of unspecified part of back, unspecified stage: Principal | ICD-10-CM | POA: Diagnosis present

## 2013-04-17 DIAGNOSIS — Z7401 Bed confinement status: Secondary | ICD-10-CM

## 2013-04-17 DIAGNOSIS — Z515 Encounter for palliative care: Secondary | ICD-10-CM

## 2013-04-17 DIAGNOSIS — E039 Hypothyroidism, unspecified: Secondary | ICD-10-CM | POA: Diagnosis present

## 2013-04-17 DIAGNOSIS — E43 Unspecified severe protein-calorie malnutrition: Secondary | ICD-10-CM | POA: Diagnosis present

## 2013-04-17 DIAGNOSIS — B961 Klebsiella pneumoniae [K. pneumoniae] as the cause of diseases classified elsewhere: Secondary | ICD-10-CM | POA: Diagnosis present

## 2013-04-17 DIAGNOSIS — E119 Type 2 diabetes mellitus without complications: Secondary | ICD-10-CM | POA: Diagnosis present

## 2013-04-17 LAB — COMPREHENSIVE METABOLIC PANEL
ALT: 10 U/L (ref 0–35)
CO2: 22 mEq/L (ref 19–32)
Calcium: 11 mg/dL — ABNORMAL HIGH (ref 8.4–10.5)
Creatinine, Ser: 0.57 mg/dL (ref 0.50–1.10)
GFR calc Af Amer: >90 mL/min (ref >90)
GFR calc non Af Amer: 79 mL/min — ABNORMAL LOW (ref >90)
Glucose, Bld: 165 mg/dL — ABNORMAL HIGH (ref 70–99)

## 2013-04-17 LAB — CBC WITH DIFFERENTIAL/PLATELET
Eosinophils Relative: 0 % (ref 0–5)
HCT: 30.8 % — ABNORMAL LOW (ref 36.0–46.0)
Lymphocytes Relative: 5 % — ABNORMAL LOW (ref 12–46)
Lymphs Abs: 1 10*3/uL (ref 0.7–4.0)
MCV: 92.8 fL (ref 78.0–100.0)
Monocytes Absolute: 1.1 10*3/uL — ABNORMAL HIGH (ref 0.1–1.0)
Monocytes Relative: 6 % (ref 3–12)
RBC: 3.32 MIL/uL — ABNORMAL LOW (ref 3.87–5.11)
WBC: 19.4 10*3/uL — ABNORMAL HIGH (ref 4.0–10.5)

## 2013-04-17 MED ORDER — SODIUM CHLORIDE 0.9 % IV SOLN: INTRAVENOUS | Status: DC

## 2013-04-17 MED ORDER — VANCOMYCIN HCL 10 G IV SOLR: 2000.0000 mg | Freq: Once | INTRAVENOUS | Status: DC

## 2013-04-17 MED ORDER — MEMANTINE HCL 10 MG PO TABS
10.0000 mg | ORAL_TABLET | Freq: Every day | ORAL | Status: DC
  Administered 2013-04-18 – 2013-04-22 (×5): 10 mg via ORAL
  Filled 2013-04-17 (×5): qty 1

## 2013-04-17 MED ORDER — BUSPIRONE HCL 15 MG PO TABS
15.0000 mg | ORAL_TABLET | Freq: Two times a day (BID) | ORAL | Status: DC
  Administered 2013-04-18 – 2013-04-22 (×7): 15 mg via ORAL
  Filled 2013-04-17 (×11): qty 1

## 2013-04-17 MED ORDER — PIPERACILLIN-TAZOBACTAM 3.375 G IVPB
3.3750 g | Freq: Three times a day (TID) | INTRAVENOUS | Status: DC
  Administered 2013-04-18 – 2013-04-21 (×10): 3.375 g via INTRAVENOUS
  Filled 2013-04-17 (×12): qty 50

## 2013-04-17 MED ORDER — MEMANTINE HCL 5 MG PO TABS
5.0000 mg | ORAL_TABLET | Freq: Every day | ORAL | Status: DC
  Administered 2013-04-18 – 2013-04-21 (×3): 5 mg via ORAL
  Filled 2013-04-17 (×6): qty 1

## 2013-04-17 MED ORDER — VANCOMYCIN HCL IN DEXTROSE 750-5 MG/150ML-% IV SOLN
750.0000 mg | Freq: Once | INTRAVENOUS | Status: AC
  Administered 2013-04-18: 750 mg via INTRAVENOUS
  Filled 2013-04-17: qty 150

## 2013-04-17 MED ORDER — SODIUM CHLORIDE 0.45 % IV SOLN
INTRAVENOUS | Status: DC
  Administered 2013-04-17: 1000 mL via INTRAVENOUS
  Administered 2013-04-19: 12:00:00 via INTRAVENOUS

## 2013-04-17 MED ORDER — VANCOMYCIN HCL 500 MG IV SOLR
500.0000 mg | INTRAVENOUS | Status: DC
  Administered 2013-04-19 – 2013-04-20 (×2): 500 mg via INTRAVENOUS
  Filled 2013-04-17 (×3): qty 500

## 2013-04-17 MED ORDER — SODIUM CHLORIDE 0.9 % IV BOLUS (SEPSIS)
1000.0000 mL | Freq: Once | INTRAVENOUS | Status: AC
  Administered 2013-04-17: 1000 mL via INTRAVENOUS

## 2013-04-17 MED ORDER — SODIUM CHLORIDE 0.9 % IV SOLN
INTRAVENOUS | Status: DC
  Administered 2013-04-17: 20:00:00 via INTRAVENOUS

## 2013-04-17 MED ORDER — PIPERACILLIN-TAZOBACTAM 3.375 G IVPB
3.3750 g | Freq: Once | INTRAVENOUS | Status: AC
  Administered 2013-04-17: 3.375 g via INTRAVENOUS
  Filled 2013-04-17: qty 50

## 2013-04-17 MED ORDER — ENOXAPARIN SODIUM 30 MG/0.3ML ~~LOC~~ SOLN
30.0000 mg | SUBCUTANEOUS | Status: DC
  Administered 2013-04-18 – 2013-04-22 (×5): 30 mg via SUBCUTANEOUS
  Filled 2013-04-17 (×5): qty 0.3

## 2013-04-17 MED ORDER — ACETAMINOPHEN 325 MG PO TABS
650.0000 mg | ORAL_TABLET | Freq: Four times a day (QID) | ORAL | Status: DC | PRN
  Administered 2013-04-20: 650 mg via ORAL
  Filled 2013-04-17: qty 2

## 2013-04-17 MED ORDER — ASPIRIN 81 MG PO CHEW
81.0000 mg | CHEWABLE_TABLET | Freq: Every day | ORAL | Status: DC
  Administered 2013-04-18 – 2013-04-22 (×5): 81 mg via ORAL
  Filled 2013-04-17 (×5): qty 1

## 2013-04-17 MED ORDER — MEMANTINE HCL 5 MG PO TABS: 5.0000 mg | ORAL_TABLET | Freq: Two times a day (BID) | ORAL | Status: DC

## 2013-04-17 MED ORDER — LEVOTHYROXINE SODIUM 50 MCG PO TABS
50.0000 ug | ORAL_TABLET | Freq: Every day | ORAL | Status: DC
  Administered 2013-04-19 – 2013-04-22 (×4): 50 ug via ORAL
  Filled 2013-04-17 (×6): qty 1

## 2013-04-17 MED ORDER — ONDANSETRON HCL 4 MG/2ML IJ SOLN: 4.0000 mg | Freq: Four times a day (QID) | INTRAMUSCULAR | Status: DC | PRN

## 2013-04-17 NOTE — ED Provider Notes (Signed)
CSN: [id number]     Arrival date & time 04/17/13  1722 History     First MD Initiated Contact with Patient 04/17/13 1752     Chief Complaint  Patient presents with  . Wound Check   (Consider location/radiation/quality/duration/timing/severity/associated sxs/prior Treatment) Patient is a 79 y.o. female presenting with wound check. The history is provided by a relative and the patient. The history is limited by the condition of the patient.  Wound Check   patient here with worsening sacral ulcer x1 week. Her daughter called her physician and she's been using zinc oxide on this. Now with increased wound drainage and oozing that has a foul smell. No fever or chills. No vomiting or diarrhea. Symptoms have been gradually worsening. She also has a pressure ulcer noted to her right hip. No recent trauma. Patient has a history of Alzheimer's and is a limited historian. According to the daughter, no recent cough or congestion. No dyspnea. Patient is bedridden  Past Medical History  Diagnosis Date  . Hypertension   . Diabetes mellitus   . Alzheimer's dementia   . Hyperthyroidism    History reviewed. No pertinent past surgical history. History reviewed. No pertinent family history. History  Substance Use Topics  . Smoking status: Never Smoker   . Smokeless tobacco: Not on file  . Alcohol Use: No   OB History   Grav Para Term Preterm Abortions TAB SAB Ect Mult Living                 Review of Systems  Unable to perform ROS   Allergies  Review of patient's allergies indicates no known allergies.  Home Medications   Current Outpatient Rx  Name  Route  Sig  Dispense  Refill  . amLODipine-benazepril (LOTREL) 10-20 MG per capsule   Oral   Take 1 capsule by mouth daily.           Marland Kitchen aspirin 81 MG chewable tablet   Oral   Chew 81 mg by mouth daily.           . busPIRone (BUSPAR) 15 MG tablet   Oral   Take 15 mg by mouth 2 (two) times daily.           . Calcium  Carbonate-Vit D-Min (CALTRATE 600+D PLUS) 600-400 MG-UNIT per tablet   Oral   Take 1 tablet by mouth daily.           Marland Kitchen levothyroxine (SYNTHROID, LEVOTHROID) 50 MCG tablet   Oral   Take 50 mcg by mouth daily.          . memantine (NAMENDA) 10 MG tablet   Oral   Take 5-10 mg by mouth See admin instructions. Takes 1 tablet in the morning and 5mg  every evening         . Multiple Vitamin (MULITIVITAMIN WITH MINERALS) TABS   Oral   Take 1 tablet by mouth daily.         . nateglinide (STARLIX) 120 MG tablet   Oral   Take 120 mg by mouth daily.            BP 98/74  Pulse 50  Temp(Src) 97.8 F (36.6 C) (Oral)  Resp 18  SpO2 97% Physical Exam  Nursing note and vitals reviewed. Constitutional: She appears lethargic. She appears cachectic. She appears toxic. She has a sickly appearance. She appears ill.  HENT:  Head: Normocephalic and atraumatic.  Eyes: Conjunctivae, EOM and lids are normal. Pupils are equal, round,  and reactive to light.  Neck: Normal range of motion. Neck supple. No tracheal deviation present. No mass present.  Cardiovascular: Normal rate, regular rhythm and normal heart sounds.  Exam reveals no gallop.   No murmur heard. Pulmonary/Chest: Effort normal and breath sounds normal. No stridor. No respiratory distress. She has no decreased breath sounds. She has no wheezes. She has no rhonchi. She has no rales.  Abdominal: Soft. Normal appearance and bowel sounds are normal. She exhibits no distension. There is no tenderness. There is no rebound and no CVA tenderness.  Musculoskeletal: Normal range of motion. She exhibits no edema and no tenderness.       Back:       Legs: Neurological: She appears lethargic. She displays atrophy. A sensory deficit is present. No cranial nerve deficit.  Patient withdraws to pain in all 4 extremities  Skin: Skin is warm and dry. No abrasion and no rash noted.  Psychiatric: Her affect is inappropriate. She is withdrawn. She is  noncommunicative.    ED Course   Procedures (including critical care time)  Labs Reviewed  CBC WITH DIFFERENTIAL  COMPREHENSIVE METABOLIC PANEL  URINALYSIS, ROUTINE W REFLEX MICROSCOPIC   No results found. No diagnosis found.  MDM  Patient labs and x-rays are pending at this time. She has a stage IV decubitus ulcer to her sacrum. She will require admission  Toy Baker, MD 04/17/13 [phone]

## 2013-04-17 NOTE — Progress Notes (Signed)
ANTIBIOTIC CONSULT NOTE - INITIAL  Pharmacy Consult for vancomycin and zosyn Indication: sacral decubitus ulcer  No Known Allergies  Patient Measurements: Height: 5\' 4"  (162.6 cm) (estimated) Weight: 85 lb (38.556 kg) (estimated) IBW/kg (Calculated) : 54.7   Vital Signs: Temp: 97.4 F (36.3 C) (08/24 1926) Temp src: Rectal (08/24 1926) BP: 113/61 mmHg (08/24 2118) Pulse Rate: 102 (08/24 2118) Intake/Output from previous day:   Intake/Output from this shift:    Labs:  Recent Labs  04/17/13 1907  WBC 19.4*  HGB 10.4*  PLT 213  CREATININE 0.57   Estimated Creatinine Clearance: 28.5 ml/min (by C-G formula based on Cr of 0.57). No results found for this basename: VANCOTROUGH, VANCOPEAK, VANCORANDOM, GENTTROUGH, GENTPEAK, GENTRANDOM, TOBRATROUGH, TOBRAPEAK, TOBRARND, AMIKACINPEAK, AMIKACINTROU, AMIKACIN,  in the last 72 hours   Microbiology: No results found for this or any previous visit (from the past 720 hour(s)).  Medical History: Past Medical History  Diagnosis Date  . Hypertension   . Diabetes mellitus   . Alzheimer's dementia   . Hyperthyroidism     Medications:  Prescriptions prior to admission  Medication Sig Dispense Refill  . amLODipine-benazepril (LOTREL) 10-20 MG per capsule Take 1 capsule by mouth daily.        Marland Kitchen aspirin 81 MG chewable tablet Chew 81 mg by mouth daily.        . busPIRone (BUSPAR) 15 MG tablet Take 15 mg by mouth 2 (two) times daily.        . Calcium Carbonate-Vit D-Min (CALTRATE 600+D PLUS) 600-400 MG-UNIT per tablet Take 2 tablets by mouth daily.       . diphenhydrAMINE (BENADRYL) 25 MG tablet Take 25 mg by mouth 2 (two) times daily as needed for itching or allergies.      Marland Kitchen levothyroxine (SYNTHROID, LEVOTHROID) 50 MCG tablet Take 50 mcg by mouth daily.       . memantine (NAMENDA) 10 MG tablet Take 5-10 mg by mouth See admin instructions. Takes 1 tablet in the morning and 5mg  every evening      . Multiple Vitamin (MULITIVITAMIN WITH  MINERALS) TABS Take 1 tablet by mouth daily.      . nateglinide (STARLIX) 60 MG tablet Take 60 mg by mouth daily.       Assessment: Mrs. Buttrey is a 79 yo F to start vancomycin and zosyn per pharmacy for sacral decubitus ulcers.  Her wt is 38.6 kg, her creat is 0.57 and her creat cl is ~ 28 ml/min.   Her WBC is elevated at 19.4 and she is afebrile.   A CT scan of her lumbar spine did not show any osteomyelitis.  She has a stage 4 sacral decubitus.  It will be evaluated by the wound nurse to determine if surgical debridement will be needed.   8/24 BC x2 8/24 wound cx 8/24 UA 8/24 zosyn >> 8/24 vancomycin >>  Goal of Therapy:  Vancomycin trough level 10-15 mcg/ml  Plan:  1. Vancomycin 750 mg IV loading dose, then vancomycin 500 mg IV q24 2. Zosyn 3.375 gm IV q8h, infuse each dose over 4 hours 3. F/u renal function, culture data, wbc, fever curve, etc 4. Check steady-state vancomycin trough as needed Herby Abraham, Pharm.D. [phone] 04/17/2013 11:41 PM

## 2013-04-17 NOTE — ED Notes (Signed)
Patient brought into the ED by her daughter who is her caregiver. Daughter states that the wounds have gotten worse within the last 2 weeks and have began draining and smelling. Patient has 4 visible wounds. The largest would is approximately 2X2 in the center of her buttocks. Wound is black and yellow and the bone is visible through the wound. This wound is draining yellow exudate through the pad and onto the bed linen. Wound #2 is on the right hip, golf ball size, reddened and yellow also smelling as well. Wound #3 is on the left hip and is the size of a quarter, yellow and reddened. The last wound is on the right shoulder blade and is the size of a quarter, reddened.  Dr. Freida Busman has been in to see the patient.

## 2013-04-17 NOTE — H&P (Signed)
[facility] History and Physical  Michele Ward WJX:[id number] DOB: 01/24/1921 DOA: 04/17/2013  Referring physician: EDP PCP: Laurena Slimmer, MD   Chief Complaint: Wounds on her bottom  HPI: Michele Ward is a 79 y.o. female with history of Alzheimer's dementia, bedridden, diabetes, hypertension, is cared for at home by her daughter, who provides all the history. Daughter reports noticing skin breakdown on her sacrum, 2-3 weeks ago. Took her to see Dr. Chestine Spore and started applying zinc oxide, without any benefit. Now the wounds with increased drainage and foul odor, also increased lethargy and poor by mouth intake for the last week. No fevers or chills, no nausea vomiting diarrhea dysuria or urinary frequency. Upon evaluation the emergency room noted to have multiple sacral decubitus one of which is stage IV, white count of 19,000, hypernatremia.   Review of Systems: The patient denies anorexia, fever, weight loss,, vision loss, decreased hearing, hoarseness, chest pain, syncope, dyspnea on exertion, peripheral edema, balance deficits, hemoptysis, abdominal pain, melena, hematochezia, severe indigestion/heartburn, hematuria, incontinence, genital sores, muscle weakness, suspicious skin lesions, transient blindness, difficulty walking, depression, unusual weight change, abnormal bleeding, enlarged lymph nodes, angioedema, and breast masses.    Past Medical History  Diagnosis Date  . Hypertension   . Diabetes mellitus   . Alzheimer's dementia   . Hyperthyroidism    History reviewed. No pertinent past surgical history. Social History:  reports that she has never smoked. She does not have any smokeless tobacco history on file. She reports that she does not drink alcohol or use illicit drugs. Was at home with daughter, bedridden, dependent in all ADLs  No Known Allergies  family history.  Unable to assess due to dementia  Prior to Admission medications   Medication Sig  Start Date End Date Taking? Authorizing Provider  amLODipine-benazepril (LOTREL) 10-20 MG per capsule Take 1 capsule by mouth daily.     Yes Historical Provider, MD  aspirin 81 MG chewable tablet Chew 81 mg by mouth daily.     Yes Historical Provider, MD  busPIRone (BUSPAR) 15 MG tablet Take 15 mg by mouth 2 (two) times daily.     Yes Historical Provider, MD  Calcium Carbonate-Vit D-Min (CALTRATE 600+D PLUS) 600-400 MG-UNIT per tablet Take 2 tablets by mouth daily.    Yes Historical Provider, MD  diphenhydrAMINE (BENADRYL) 25 MG tablet Take 25 mg by mouth 2 (two) times daily as needed for itching or allergies.   Yes Historical Provider, MD  levothyroxine (SYNTHROID, LEVOTHROID) 50 MCG tablet Take 50 mcg by mouth daily.    Yes Historical Provider, MD  memantine (NAMENDA) 10 MG tablet Take 5-10 mg by mouth See admin instructions. Takes 1 tablet in the morning and 5mg  every evening   Yes Historical Provider, MD  Multiple Vitamin (MULITIVITAMIN WITH MINERALS) TABS Take 1 tablet by mouth daily.   Yes Historical Provider, MD  nateglinide (STARLIX) 60 MG tablet Take 60 mg by mouth daily.   Yes Historical Provider, MD   Physical Exam: Filed Vitals:   04/17/13 2118  BP: 113/61  Pulse: 102  Temp:   Resp: 18     General: Lethargic arousable mumbles yes and no  Extremely cachectic  HEENT: PERRLA, EOMI  CVS S1-S2 regular rate rhythm no murmurs rubs or gallops  Lungs clear auscultation bilaterally  Abdomen soft scaphoid nontender nondistended normal bowel sounds no organomegaly  Extremities no edema clubbing or cyanosis  Sacrum 5 x 5 cm deep stage IV sacral decubitus ulcer with central discoloration and foul  odor  Smaller stage II wounds noted near the ischial tuberosities  Skin as noted above  Psychiatric unable to assess  Labs on Admission:  Basic Metabolic Panel:  Recent Labs Lab 04/17/13 1907  NA 151*  K 3.6  CL 118*  CO2 22  GLUCOSE 165*  BUN 29*  CREATININE 0.57   CALCIUM 11.0*   Liver Function Tests:  Recent Labs Lab 04/17/13 1907  AST 17  ALT 10  ALKPHOS 79  BILITOT 0.3  PROT 6.1  ALBUMIN 2.2*   No results found for this basename: LIPASE, AMYLASE,  in the last 168 hours No results found for this basename: AMMONIA,  in the last 168 hours CBC:  Recent Labs Lab 04/17/13 1907  WBC 19.4*  NEUTROABS 17.2*  HGB 10.4*  HCT 30.8*  MCV 92.8  PLT 213   Cardiac Enzymes: No results found for this basename: CKTOTAL, CKMB, CKMBINDEX, TROPONINI,  in the last 168 hours  BNP (last 3 results) No results found for this basename: PROBNP,  in the last 8760 hours CBG: No results found for this basename: GLUCAP,  in the last 168 hours  Radiological Exams on Admission: Dg Chest 2 View  04/17/2013   CLINICAL DATA:  Pain. Altered mental status.  EXAM: CHEST  2 VIEW  COMPARISON:  Radiographs 12/02/2011. CT 03/03/2013.  FINDINGS: There is a moderate thoracic kyphosis. The bones are diffusely demineralized. Thoracolumbar compression deformities and deformity of the sternum are grossly unchanged. The heart size and mediastinal contours are stable with diffuse thoracic aortic ectasia. The lungs appear clear. There is no pleural effusion or pneumothorax. Advanced glenohumeral degenerative changes are present on the left.  IMPRESSION: No acute cardiopulmonary process identified.   Electronically Signed   By: Roxy Horseman   On: 04/17/2013 20:26   Dg Lumbar Spine Complete  04/17/2013   CLINICAL DATA:  Back pain.  Soft tissue ulcerations.  EXAM: LUMBAR SPINE - COMPLETE 4+ VIEW  COMPARISON:  CTs of the chest, abdomen and pelvis 03/03/2013.  FINDINGS: The bones are severely demineralized. There is a thoracolumbar kyphoscoliosis. Multiple compression deformities are grossly unchanged. Within the lumbar spine, these are demonstrated at L3, L4 and L5. No new fractures are identified. There is diffuse atherosclerosis.  IMPRESSION: No apparent change in multiple  thoracolumbar compression deformities. Severe osteopenia.   Electronically Signed   By: Roxy Horseman   On: 04/17/2013 20:29    Assessment/Plan Principal Problem:   Decubitus ulcer of sacral region, stage 4 Active Problems:   Hypernatremia   Dementia   Severe protein-calorie malnutrition   1. Wound Infection/Sacral decubitus -stage 4 and smaller stage 2 wounds -start IV Abx-Vanc/Zosyn -wound Cx drawn -WOund RN consult, if needs surgical debridement will need surgical eval -Nutrition consult -check Lactic acid -FU UA, still pending  2. Dementia -continue namenda  3. DM -hold starlix -SSI  4. Hypothyroidism -continue synthroid  5. Hypernatremia - hydrate with 1/2 NS and monitor   6. Hypercalcemia: -Most likely secondary to dehydration -If does not correct with IV  fluids, will need workup  7. severe protein calorie malnutrition -Albumin 2.2 -Nutrition consult  DVT prophylaxis with Lovenox  Code Status: Full Code per daughter Family Communication: d/w daughter at bedside Disposition Plan:inpatient Time spent:  Donae Kueker [facility] Pager [phone]  If 7PM-7AM, please contact night-coverage [email or web address] Password [email or web address] 04/17/2013, 10:51 PM

## 2013-04-17 NOTE — ED Provider Notes (Signed)
Patient is a 79 year old female past medical history significant for Alzheimer's dementia, hypertension, DM presents emergency Department with her daughter for 2 weeks of worsening sacral ulcer with purulent drainage and foul smell along with pressure ulcer to left hip area. Patient is bed bound at home. No fevers or chills. Dr. Rosine Abe dorsal patient has had decreased by mouth intake over the last 2 weeks as well. Physical Exam  BP 113/61  Pulse 102  Temp(Src) 97.4 F (36.3 C) (Rectal)  Resp 18  SpO2 100%  Physical Exam  Constitutional: She appears lethargic. She appears cachectic. She has a sickly appearance. She appears ill.  HENT:  Head: Normocephalic and atraumatic.  Right Ear: External ear normal.  Left Ear: External ear normal.  Nose: Nose normal.  Mouth/Throat: Mucous membranes are dry.  Eyes: Conjunctivae are normal.  Neck: Neck supple.  Cardiovascular: Normal rate.   Pulmonary/Chest: Effort normal.  Musculoskeletal: She exhibits no edema.  Neurological: She appears lethargic.  Skin: Skin is warm and dry. No pallor.  6 cm x 5 cm stage IV decubitus ulcer on sacrum w/ purulent drainge.   3 cm x 3 cm stage II decubitus ulcer to left hip.     ED Course  Procedures  Medications  0.9 %  sodium chloride infusion ( Intravenous New Bag/Given 04/17/13 1940)  vancomycin (VANCOCIN) 2,000 mg in sodium chloride 0.9 % 500 mL IVPB (not administered)  piperacillin-tazobactam (ZOSYN) IVPB 3.375 g (3.375 g Intravenous New Bag/Given 04/17/13 2220)  0.9 %  sodium chloride infusion (not administered)  sodium chloride 0.9 % bolus 1,000 mL (1,000 mLs Intravenous New Bag/Given 04/17/13 1832)     MDM 9:33 PM Wound culture, BC x 2 obtained. Vanc and Zoysn will be started. Fluid bolus given.   Discussed case with Dr. Fonnie Jarvis and patient will be admitted to Hospital for infected stage IV and stage II decubitus ulcers. I have reviewed nursing notes, vital signs, and all appropriate lab and  imaging results for this patient. Patient will be admitted to the hospitalist service under Dr. Edison Simon care. Admission hold orders placed. The patient appears reasonably stabilized for admission considering the current resources, flow, and capabilities available in the ED at this time, and I doubt any other [facility] requiring further screening and/or treatment in the ED prior to admission.          MDM Number of Diagnoses or Management Options Sacral decubitus ulcer, stage IV:           Jeannetta Ellis, PA-C 04/17/13 2248

## 2013-04-17 NOTE — ED Notes (Signed)
EKG attempted

## 2013-04-17 NOTE — ED Notes (Signed)
Family reports that they noticed a wound to the patient sacral area about a week ago. Area is reported to be red and ozzing. Denies any fever. Also reports a wound to the left hip area. Pt is bed bound at home.

## 2013-04-18 DIAGNOSIS — R5381 Other malaise: Secondary | ICD-10-CM | POA: Diagnosis present

## 2013-04-18 DIAGNOSIS — E876 Hypokalemia: Secondary | ICD-10-CM

## 2013-04-18 DIAGNOSIS — L8990 Pressure ulcer of unspecified site, unspecified stage: Secondary | ICD-10-CM

## 2013-04-18 DIAGNOSIS — D649 Anemia, unspecified: Secondary | ICD-10-CM | POA: Diagnosis present

## 2013-04-18 LAB — GLUCOSE, CAPILLARY: Glucose-Capillary: 94 mg/dL (ref 70–99)

## 2013-04-18 LAB — MAGNESIUM: Magnesium: 2.1 mg/dL (ref 1.5–2.5)

## 2013-04-18 LAB — BASIC METABOLIC PANEL
GFR calc Af Amer: >90 mL/min (ref >90)
GFR calc non Af Amer: 83 mL/min — ABNORMAL LOW (ref >90)
Potassium: 2.6 mEq/L — CL (ref 3.5–5.1)
Sodium: 153 mEq/L — ABNORMAL HIGH (ref 135–145)

## 2013-04-18 LAB — URINALYSIS, ROUTINE W REFLEX MICROSCOPIC
Bilirubin Urine: NEGATIVE
Nitrite: NEGATIVE
Specific Gravity, Urine: 1.012 (ref 1.005–1.030)
Urobilinogen, UA: 1 mg/dL (ref 0.0–1.0)

## 2013-04-18 LAB — CBC
Hemoglobin: 9 g/dL — ABNORMAL LOW (ref 12.0–15.0)
MCHC: 32.8 g/dL (ref 30.0–36.0)
RDW: 14.8 % (ref 11.5–15.5)

## 2013-04-18 LAB — URINE MICROSCOPIC-ADD ON

## 2013-04-18 MED ORDER — POTASSIUM CHLORIDE 10 MEQ/100ML IV SOLN
10.0000 meq | INTRAVENOUS | Status: AC
  Administered 2013-04-18 (×3): 10 meq via INTRAVENOUS
  Filled 2013-04-18 (×4): qty 100

## 2013-04-18 MED ORDER — POTASSIUM CHLORIDE 10 MEQ/100ML IV SOLN
10.0000 meq | Freq: Once | INTRAVENOUS | Status: AC
  Administered 2013-04-18: 10 meq via INTRAVENOUS
  Filled 2013-04-18: qty 100

## 2013-04-18 MED ORDER — MEMANTINE HCL 5 MG PO TABS: 5.0000 mg | ORAL_TABLET | Freq: Two times a day (BID) | ORAL | Status: DC

## 2013-04-18 MED ORDER — ADULT MULTIVITAMIN W/MINERALS CH
1.0000 | ORAL_TABLET | Freq: Every day | ORAL | Status: DC
  Administered 2013-04-18 – 2013-04-22 (×5): 1 via ORAL
  Filled 2013-04-18 (×5): qty 1

## 2013-04-18 MED ORDER — PNEUMOCOCCAL VAC POLYVALENT 25 MCG/0.5ML IJ INJ
0.5000 mL | INJECTION | INTRAMUSCULAR | Status: AC
  Administered 2013-04-19: 0.5 mL via INTRAMUSCULAR
  Filled 2013-04-18: qty 0.5

## 2013-04-18 MED ORDER — ENSURE COMPLETE PO LIQD
237.0000 mL | Freq: Two times a day (BID) | ORAL | Status: DC
  Administered 2013-04-19 – 2013-04-22 (×6): 237 mL via ORAL

## 2013-04-18 NOTE — Progress Notes (Signed)
INITIAL NUTRITION ASSESSMENT  DOCUMENTATION CODES Per approved criteria  -Severe malnutrition in the context of chronic illness -Underweight   INTERVENTION: 1. Once diet advanced will order, Ensure Complete po BID, each supplement provides 350 kcal and 13 grams of protein.  NUTRITION DIAGNOSIS: Malnutrition related to dementia as evidenced by severe fat and muscle wasting and 26% weight loss x 1 month.   Goal: Pt to meet >/= 90% of their estimated nutrition needs   Monitor:  PO intake, swallow ability, supplement acceptance  Reason for Assessment: MD Consult  79 y.o. female  Admitting Dx: Decubitus ulcer of sacral region, stage 4  ASSESSMENT: Pt unable to answer questions due to hx of dementia. Per daughter (primary cg) pt has been losing small amount of weight steadily for the last 2 years.  One month ago pt weighed 115 lb but [birth year] her [birth year] pt started having skin breakdown that continued to get worse. Pt stopped eating and more recently was spitting foods out and holding food in her mouth. Daughter gave pt some yogurt, pudding, and ensure which she did better with but continued to eat poorly. Daughter describes large, rapid weight loss.  Pt with hypokalemia and would be a very high risk for refeeding syndrome with enteral nutrition.  SLP evaluated pt today and cleared pt for Dysphagia I with thin liquids.  Nutrition Focused Physical Exam:  Subcutaneous Fat:  Orbital Region: severe wasting Upper Arm Region: severe wasting Thoracic and Lumbar Region: severe wasting  Muscle:  Temple Region: severe wasting Clavicle Bone Region: severe wasting Clavicle and Acromion Bone Region: severe wasting Scapular Bone Region: severe wasting Dorsal Hand: severe wasting Patellar Region: severe wasting Anterior Thigh Region: severe wasting Posterior Calf Region: severe wasting  Edema: not present   Height: Ht Readings from Last 1 Encounters:  04/17/13 5\' 4"  (1.626 m)     Weight: Wt Readings from Last 1 Encounters:  04/17/13 85 lb (38.556 kg)    Ideal Body Weight: 54.5 kg   % Ideal Body Weight: 71%  Wt Readings from Last 10 Encounters:  04/17/13 85 lb (38.556 kg)  12/02/11 115 lb (52.164 kg)    Usual Body Weight: 115 lb   % Usual Body Weight: 74%  BMI:  Body mass index is 14.58 kg/(m^2).  Estimated Nutritional Needs: Kcal: 1200-1400 Protein: 60-75 grams Fluid: > 1.5 L/day  Skin:  Stage IV pressure ulcer on sacrum Unstageable pressure ulcer on hip Stage I pressure ulcer on shoulder  Diet Order: NPO  EDUCATION NEEDS: -No education needs identified at this time   Intake/Output Summary (Last 24 hours) at 04/18/13 1328 Last data filed at 04/18/13 0700  Gross per 24 hour  Intake    950 ml  Output   1300 ml  Net   -350 ml    Last BM: 8/24   Labs:   Recent Labs Lab 04/17/13 1907 04/18/13 0630  NA 151* 153*  K 3.6 2.6*  CL 118* 121*  CO2 22 24  BUN 29* 20  CREATININE 0.57 0.49*  CALCIUM 11.0* 10.2  MG  --  2.1  GLUCOSE 165* 116*    CBG (last 3)   Recent Labs  04/18/13 0411  GLUCAP 142*    Scheduled Meds: . aspirin  81 mg Oral Daily  . busPIRone  15 mg Oral BID  . enoxaparin (LOVENOX) injection  30 mg Subcutaneous Q24H  . levothyroxine  50 mcg Oral QAC breakfast  . memantine  10 mg Oral Daily   And  .  memantine  5 mg Oral QHS  . piperacillin-tazobactam (ZOSYN)  IV  3.375 g Intravenous Q8H  . [START ON 04/19/2013] vancomycin  500 mg Intravenous Q24H    Continuous Infusions: . sodium chloride 1,000 mL (04/17/13 2336)    Past Medical History  Diagnosis Date  . Hypertension   . Diabetes mellitus   . Alzheimer's dementia   . Hyperthyroidism     History reviewed. No pertinent past surgical history.  Kendell Bane RD, LDN, CNSC [phone] Pager [phone] After Hours Pager

## 2013-04-18 NOTE — Progress Notes (Signed)
[facility] PROGRESS NOTE  Cheyanne Lamison OZH:[id number] DOB: 1920-12-12 DOA: 04/17/2013 PCP: Laurena Slimmer, MD  Assessment/Plan:    Decubitus ulcer of sacral region, stage 4, infected:  Continue vanc and zosyn.  Culture pending.  SEVERLY malnourished.  Wound healing will be impaired.  WOC consult pending. Air mattress overlay.    Severe protein-calorie malnutrition:  Diet per speech.  D1 thins. Add ensure. Consult dietitian    Decubitus ulcers, multiple: in addition to above, bilateral trochanters, right, midback.    Hypernatremia secondary to poor intake/dehydration.  Continue hypotonic fluids    Hypokalemia: replete IV    Anemia: check anemia panel    Dementia    Debility: daughter agreeable to SNF.  Prognosis guarded.  Daughter voices understanding, but would like full code, and aggressive care. Would consider hospice if deteriorates.  Code Status: full Family Communication: daughter at bedside Disposition Plan: SNF   Consultants: WOC  Procedures:    Antibiotics:  Zosyn  vanc  HPI/Subjective: No complaints  Objective: Filed Vitals:   04/18/13 1300  BP: 99/76  Pulse: 100  Temp: 97.6 F (36.4 C)  Resp:     Intake/Output Summary (Last 24 hours) at 04/18/13 1628 Last data filed at 04/18/13 0700  Gross per 24 hour  Intake    950 ml  Output   1300 ml  Net   -350 ml   Filed Weights   04/17/13 2118  Weight: 38.556 kg (85 lb)    Exam:   General:  Cachectic. Asleep arousable. confused  Cardiovascular: RRR without MGR  Respiratory: cta without WRR  Abdomen: S, NT, ND  Ext: no cce, severe muscles wasting  Skin:  3-4 cm sacral decubitus with purulent drainage, foul odor. bilateral trochanter decubiti with unstageable. Right back with stage 2 pressure ulcer .  Data Reviewed: Basic Metabolic Panel:  Recent Labs Lab 04/17/13 1907 04/18/13 0630  NA 151* 153*  K 3.6 2.6*  CL 118* 121*  CO2 22 24  GLUCOSE 165* 116*  BUN 29* 20   CREATININE 0.57 0.49*  CALCIUM 11.0* 10.2  MG  --  2.1   Liver Function Tests:  Recent Labs Lab 04/17/13 1907  AST 17  ALT 10  ALKPHOS 79  BILITOT 0.3  PROT 6.1  ALBUMIN 2.2*   No results found for this basename: LIPASE, AMYLASE,  in the last 168 hours No results found for this basename: AMMONIA,  in the last 168 hours CBC:  Recent Labs Lab 04/17/13 1907 04/18/13 0630  WBC 19.4* 20.1*  NEUTROABS 17.2*  --   HGB 10.4* 9.0*  HCT 30.8* 27.4*  MCV 92.8 91.9  PLT 213 273   Cardiac Enzymes: No results found for this basename: CKTOTAL, CKMB, CKMBINDEX, TROPONINI,  in the last 168 hours BNP (last 3 results) No results found for this basename: PROBNP,  in the last 8760 hours CBG:  Recent Labs Lab 04/18/13 0411 04/18/13 1252  GLUCAP 142* 94    Recent Results (from the past 240 hour(s))  WOUND CULTURE     Status: None   Collection Time    04/17/13 10:01 PM      Result Value Range Status   Specimen Description WOUND BUTTOCKS   Final   Special Requests NONE   Final   Gram Stain     Final   Value: MODERATE WBC PRESENT,BOTH PMN AND MONONUCLEAR     NO SQUAMOUS EPITHELIAL CELLS SEEN     MODERATE GRAM NEGATIVE RODS     FEW GRAM  POSITIVE COCCI IN PAIRS     Performed at [facility]   Culture PENDING   Incomplete   Report Status PENDING   Incomplete     Studies: Dg Chest 2 View  04/17/2013   CLINICAL DATA:  Pain. Altered mental status.  EXAM: CHEST  2 VIEW  COMPARISON:  Radiographs 12/02/2011. CT 03/03/2013.  FINDINGS: There is a moderate thoracic kyphosis. The bones are diffusely demineralized. Thoracolumbar compression deformities and deformity of the sternum are grossly unchanged. The heart size and mediastinal contours are stable with diffuse thoracic aortic ectasia. The lungs appear clear. There is no pleural effusion or pneumothorax. Advanced glenohumeral degenerative changes are present on the left.  IMPRESSION: No acute cardiopulmonary process identified.    Electronically Signed   By: Roxy Horseman   On: 04/17/2013 20:26   Dg Lumbar Spine Complete  04/17/2013   CLINICAL DATA:  Back pain.  Soft tissue ulcerations.  EXAM: LUMBAR SPINE - COMPLETE 4+ VIEW  COMPARISON:  CTs of the chest, abdomen and pelvis 03/03/2013.  FINDINGS: The bones are severely demineralized. There is a thoracolumbar kyphoscoliosis. Multiple compression deformities are grossly unchanged. Within the lumbar spine, these are demonstrated at L3, L4 and L5. No new fractures are identified. There is diffuse atherosclerosis.  IMPRESSION: No apparent change in multiple thoracolumbar compression deformities. Severe osteopenia.   Electronically Signed   By: Roxy Horseman   On: 04/17/2013 20:29    Scheduled Meds: . aspirin  81 mg Oral Daily  . busPIRone  15 mg Oral BID  . enoxaparin (LOVENOX) injection  30 mg Subcutaneous Q24H  . levothyroxine  50 mcg Oral QAC breakfast  . memantine  10 mg Oral Daily   And  . memantine  5 mg Oral QHS  . piperacillin-tazobactam (ZOSYN)  IV  3.375 g Intravenous Q8H  . [START ON 04/19/2013] vancomycin  500 mg Intravenous Q24H   Continuous Infusions: . sodium chloride 1,000 mL (04/17/13 2336)   Time spent: 35 min  Darby Fleeman L  [facility] Pager [phone]. If 7PM-7AM, please contact night-coverage at [email or web address], password [facility] 04/18/2013, 4:28 PM  LOS: 1 day

## 2013-04-18 NOTE — Progress Notes (Signed)
UR COMPLETED  

## 2013-04-18 NOTE — Evaluation (Signed)
Clinical/Bedside Swallow Evaluation Patient Details  Name: Michele Ward MRN: [id number] Date of Birth: 1920-12-08  Today's Date: 04/18/2013 Time: 4098-1191 SLP Time Calculation (min): 30 min  Past Medical History:  Past Medical History  Diagnosis Date  . Hypertension   . Diabetes mellitus   . Alzheimer's dementia   . Hyperthyroidism    Past Surgical History: History reviewed. No pertinent past surgical history. HPI:   79 y.o. female with history of Alzheimer's dementia, bedridden, diabetes, hypertension, is cared for at home by her daughter, admitted with worsening decubitus.   Daughter, [name], present, and describes decreased intake lately with some oral pocketing of foods, but no coughing/choking with PO intake.    Assessment / Plan / Recommendation Clinical Impression  Pt with advanced Alzheimer's dz presents with functional swallow with adequate attention to bolus, swift swallow trigger, and no signs of compromised airway protection with PO intake.  Daughter describes pocketing food with hard solids; she assists her mother with all meals.  We discussed the progressive nature of Alzheimer's and its ultimate impact on swallow function.  Fortunately, today, Ms. Bouch's swallow is quite functional; however, we discussed the benefits/burdens of feeding tubes in this population should Ms. Fine's daughter, [name], ever be asked to make a decision about artificial feeding.  [name] verbalized her understanding and her desire to maintain her mother's quality of life as a priority.    Recommend initiating a dysphagia 1 diet with thin liquids; careful hand-feeding with attention to bolus size and rate of feeding.  Meds whole in puree.  No further SLP needs are identified; education complete; will sign-off.     Aspiration Risk  Mild    Diet Recommendation Dysphagia 1 (Puree);Thin liquid   Liquid Administration via: Cup;Straw Medication Administration: Whole meds with  puree Supervision: Full supervision/cueing for compensatory strategies;Trained caregiver to feed patient Compensations: Slow rate;Small sips/bites Postural Changes and/or Swallow Maneuvers: Seated upright 90 degrees    Other  Recommendations Oral Care Recommendations: Oral care BID   Follow Up Recommendations  None    SLP Swallow Goals     Swallow Study Prior Functional Status       General HPI:  79 y.o. female with history of Alzheimer's dementia, bedridden, diabetes, hypertension, is cared for at home by her daughter, admitted with worsening decubitus.   Daughter, [name], present, and describes decreased intake lately with some oral pocketing of foods, but no coughing/choking with PO intake.  Type of Study: Bedside swallow evaluation Previous Swallow Assessment: none per records Diet Prior to this Study: Dysphagia 3 (soft);Thin liquids;Other (Comment) (at home) Temperature Spikes Noted: No Behavior/Cognition: Alert;Cooperative;Pleasant mood;Confused Oral Cavity - Dentition: Missing dentition Self-Feeding Abilities: Total assist Patient Positioning: Upright in bed Baseline Vocal Quality: Clear Volitional Cough: Strong Volitional Swallow: Unable to elicit    Oral/Motor/Sensory Function Overall Oral Motor/Sensory Function: Appears within functional limits for tasks assessed   Ice Chips Ice chips: Not tested   Thin Liquid Thin Liquid: Within functional limits Presentation: Cup;Spoon    Nectar Thick Nectar Thick Liquid: Not tested   Honey Thick Honey Thick Liquid: Not tested   Puree Puree: Within functional limits Presentation: Spoon   Solid   GO    Solid: Not tested       Blenda Mounts Laurice 04/18/2013,11:55 AM

## 2013-04-18 NOTE — Progress Notes (Signed)
Daughter states patient pocketing food yesterday in mouth, so fed her applesauce and yogurt without coughing or gagging. No trouble swallowing water,liquids.

## 2013-04-18 NOTE — Progress Notes (Signed)
Critical Potassium texted to DR Lendell Caprice.

## 2013-04-19 LAB — CBC
Hemoglobin: 9.6 g/dL — ABNORMAL LOW (ref 12.0–15.0)
RBC: 3.17 MIL/uL — ABNORMAL LOW (ref 3.87–5.11)

## 2013-04-19 LAB — COMPREHENSIVE METABOLIC PANEL
CO2: 22 mEq/L (ref 19–32)
Calcium: 10.4 mg/dL (ref 8.4–10.5)
Creatinine, Ser: 0.49 mg/dL — ABNORMAL LOW (ref 0.50–1.10)
GFR calc Af Amer: >90 mL/min (ref >90)
GFR calc non Af Amer: 83 mL/min — ABNORMAL LOW (ref >90)
Glucose, Bld: 99 mg/dL (ref 70–99)

## 2013-04-19 LAB — IRON AND TIBC
Iron: 10 ug/dL — ABNORMAL LOW (ref 42–135)
TIBC: 99 ug/dL — ABNORMAL LOW (ref 250–470)
UIBC: 89 ug/dL — ABNORMAL LOW (ref 125–400)

## 2013-04-19 LAB — GLUCOSE, CAPILLARY: Glucose-Capillary: 94 mg/dL (ref 70–99)

## 2013-04-19 LAB — FERRITIN: Ferritin: 861 ng/mL — ABNORMAL HIGH (ref 10–291)

## 2013-04-19 LAB — URINE CULTURE: Colony Count: >=100000

## 2013-04-19 MED ORDER — DIPHENHYDRAMINE HCL 25 MG PO CAPS: 50.0000 mg | ORAL_CAPSULE | Freq: Every evening | ORAL | Status: DC | PRN

## 2013-04-19 MED ORDER — DIPHENHYDRAMINE HCL 12.5 MG/5ML PO ELIX
12.5000 mg | ORAL_SOLUTION | Freq: Once | ORAL | Status: AC
  Administered 2013-04-19: 12.5 mg via ORAL
  Filled 2013-04-19: qty 5

## 2013-04-19 MED ORDER — ZOLPIDEM TARTRATE 5 MG PO TABS: 5.0000 mg | ORAL_TABLET | Freq: Every evening | ORAL | Status: DC | PRN

## 2013-04-19 NOTE — Progress Notes (Addendum)
Clinical Social Work Department CLINICAL SOCIAL WORK PLACEMENT NOTE 04/19/2013  Patient:  Michele Ward, Michele Ward  Account Number:  [id number] Admit date:  04/17/2013  Clinical Social Worker:  Theresia Bough, Theresia Majors  Date/time:  04/19/2013 10:11 AM  Clinical Social Work is seeking post-discharge placement for this patient at the following level of care:   SKILLED NURSING   (*CSW will update this form in Epic as items are completed)   04/19/2013  Patient/family provided with [facility] Department of Clinical Social Work's list of facilities offering this level of care within the geographic area requested by the patient (or if unable, by the patient's family).  04/19/2013  Patient/family informed of their freedom to choose among providers that offer the needed level of care, that participate in Medicare, Medicaid or managed care program needed by the patient, have an available bed and are willing to accept the patient.  04/19/2013  Patient/family informed of [facility]' ownership interest in [facility], as well as of the fact that they are under no obligation to receive care at this facility.  PASARR submitted to [employer] on 04/19/2013 PASARR number received from [employer] on 04/18/13 #[id number]   FL2 transmitted to all facilities in geographic area requested by pt/family on  04/19/2013 FL2 transmitted to all facilities within larger geographic area on   Patient informed that his/her managed care company has contracts with or will negotiate with  certain facilities, including the following:     Patient/family informed of bed offers received:   Patient chooses bed at  Physician recommends and patient chooses bed at    Patient to be transferred to  on   Patient to be transferred to facility by   The following physician request were entered in Epic:   Additional Comments: Daughter [name] requesting SNF placement at [facility].  Theresia Bough, MSW, LCSW [phone]

## 2013-04-19 NOTE — ED Provider Notes (Signed)
Medical screening examination/treatment/procedure(s) were performed by non-physician practitioner and as supervising physician I was immediately available for consultation/collaboration.  Hurman Horn, MD 04/19/13 2018

## 2013-04-19 NOTE — Consult Note (Signed)
WOC consult Note Reason for Consult: Consult requested for multiple pressure ulcers.  Daughter at bedside has been caring for her mother at home and states these wounds have become worse in the past 3 weeks.  Nutrition consult has been performed and states that pt has not been eating, taking adequate po, and has lost 25% of her body weight during the past month.  Pt is emaciated and immobile, frequently incontinent of stool and difficult to keep sacral wound from becoming soiled.  Albumin is 2.2.  Multiple systemic factors will impair healing. Wound type: Sacrum Unstageable: 6X4X.8cm.100% loose eschar hanging over wound, large amt tan drainage and strong foul odor.  Left trochanter Unstageable 3X8.5cm, 10% red, 90% slough, mod tan drainage, some strong odor. Right trochanter Unstageable 3X3cm, 10% red, 90% slough, mod tan drainage, some strong odor. Pressure Ulcer POA: Yes  Dressing procedure/placement/frequency: Discussed plan of care with daughter at bedside.  If aggressive plan of care is desired; then this would involve a surgical consult and hydrotherapy to assist with removal of nonviable tissue.  Process would be very painful and there is limited potential for healing R/T dehabilitated state.  Comfort care would involve palliative care team meeting to discuss options which would decrease pain.  Daughter states she will think about these issues and discuss with the primary team.  Air mattress has been ordered to decrease pressure and increase comfort.  Nutrition consult performed already.  Moist gauze packing to left trochanter, right trochanter, and sacrum to absorb drainage and decrease odor.  Conservative sharp wound debridement (CSWD performed at the bedside: Performed conservative sharp debridement using scissors and forceps to sacrum and left trochanter.  Removed a significant amt of slough from sacrum wound, revealing stage 4 wound with exposed bone.  Pt tolerated without c/o pain or bleeding.   Left trochanter removed small amt loose slough in the same manner.  Discussed pressure ulcer etiology, topical treatment, and preventive measures with daughter, appears to understand. Please re-consult if further assistance is needed.  Thank-you,  Cammie Mcgee MSN, RN, CWOCN, [place], CNS [phone]

## 2013-04-19 NOTE — Progress Notes (Signed)
Clinical Child psychotherapist (CSW) provided pt daughter [name] with bed offers for pt. Daughter resting and requested time to look over the list. CSW will follow up with daughter tomorrow morning.  Theresia Bough, MSW, LCSW [phone]

## 2013-04-19 NOTE — Progress Notes (Signed)
[facility] PROGRESS NOTE  Michele Ward ZOX:[id number] DOB: Jun 05, 1921 DOA: 04/17/2013 PCP: Laurena Slimmer, MD  Assessment/Plan:    Decubitus ulcer of sacral region, stage 4, infected:  Continue vanc and zosyn.  Culture pending.  SEVERLY malnourished.  Wound healing will be impaired.  WOC consulted. Air mattress overlay.  Daughter has decided against aggressive management (ie surgical consult, pulse lavage, etc). Agreeable to palliative care consult     Severe protein-calorie malnutrition:  Diet per speech.  D1 thins. Add ensure. Consult dietitian    Decubitus ulcers, multiple: in addition to above, bilateral trochanters, right, midback.    Hypernatremia secondary to poor intake/dehydration.  Continue hypotonic fluids    Hypokalemia: replete IV    Anemia: check anemia panel    Dementia    Debility: daughter agreeable to SNF.  Prognosis guarded.  Daughter voices understanding, agreeable to palliative care consult. Would consider hospice if deteriorates.  Code Status: full Family Communication: daughter at bedside Disposition Plan: SNF   Consultants: WOC  Procedures:    Antibiotics:  Zosyn  vanc  HPI/Subjective: No complaints  Objective: Filed Vitals:   04/19/13 1535  BP: 105/57  Pulse: 90  Temp: 98 F (36.7 C)  Resp: 16    Intake/Output Summary (Last 24 hours) at 04/19/13 1624 Last data filed at 04/19/13 0500  Gross per 24 hour  Intake      0 ml  Output    250 ml  Net   -250 ml   Filed Weights   04/17/13 2118  Weight: 38.556 kg (85 lb)    Exam:   General:  Cachectic. Asleep arousable. confused  Cardiovascular: RRR without MGR  Respiratory: cta without WRR  Abdomen: S, NT, ND  Ext: no cce, severe muscles wasting  Skin:  3-4 cm sacral decubitus with purulent drainage, foul odor. bilateral trochanter decubiti with unstageable. Right back with stage 2 pressure ulcer .  Data Reviewed: Basic Metabolic Panel:  Recent Labs Lab  04/17/13 1907 04/18/13 0630 04/19/13 0700  NA 151* 153* 150*  K 3.6 2.6* 3.6  CL 118* 121* 119*  CO2 22 24 22   GLUCOSE 165* 116* 99  BUN 29* 20 16  CREATININE 0.57 0.49* 0.49*  CALCIUM 11.0* 10.2 10.4  MG  --  2.1  --    Liver Function Tests:  Recent Labs Lab 04/17/13 1907 04/19/13 0700  AST 17 18  ALT 10 12  ALKPHOS 79 98  BILITOT 0.3 0.2*  PROT 6.1 5.8*  ALBUMIN 2.2* 1.9*   No results found for this basename: LIPASE, AMYLASE,  in the last 168 hours No results found for this basename: AMMONIA,  in the last 168 hours CBC:  Recent Labs Lab 04/17/13 1907 04/18/13 0630 04/19/13 0700  WBC 19.4* 20.1* 16.2*  NEUTROABS 17.2*  --   --   HGB 10.4* 9.0* 9.6*  HCT 30.8* 27.4* 28.3*  MCV 92.8 91.9 89.3  PLT 213 273 256   Cardiac Enzymes: No results found for this basename: CKTOTAL, CKMB, CKMBINDEX, TROPONINI,  in the last 168 hours BNP (last 3 results) No results found for this basename: PROBNP,  in the last 8760 hours CBG:  Recent Labs Lab 04/18/13 0411 04/18/13 1252 04/18/13 2149 04/19/13 0627 04/19/13 1128  GLUCAP 142* 94 165* 109* 107*    Recent Results (from the past 240 hour(s))  WOUND CULTURE     Status: None   Collection Time    04/17/13 10:01 PM      Result Value Range Status  Specimen Description WOUND BUTTOCKS   Final   Special Requests NONE   Final   Gram Stain     Final   Value: MODERATE WBC PRESENT,BOTH PMN AND MONONUCLEAR     NO SQUAMOUS EPITHELIAL CELLS SEEN     MODERATE GRAM NEGATIVE RODS     FEW GRAM POSITIVE COCCI IN PAIRS     Performed at [facility]   Culture     Final   Value: Culture reincubated for better growth     Performed at [facility]   Report Status PENDING   Incomplete  CULTURE, BLOOD (ROUTINE X 2)     Status: None   Collection Time    04/17/13 10:15 PM      Result Value Range Status   Specimen Description BLOOD LEFT HAND   Final   Special Requests BOTTLES DRAWN AEROBIC ONLY 5CC   Final   Culture   Setup Time     Final   Value: 04/18/2013 10:57     Performed at [facility]   Culture     Final   Value:        BLOOD CULTURE RECEIVED NO GROWTH TO DATE CULTURE WILL BE HELD FOR 5 DAYS BEFORE ISSUING A FINAL NEGATIVE REPORT     Performed at [facility]   Report Status PENDING   Incomplete  CULTURE, BLOOD (ROUTINE X 2)     Status: None   Collection Time    04/17/13 10:30 PM      Result Value Range Status   Specimen Description BLOOD LEFT HAND   Final   Special Requests BOTTLES DRAWN AEROBIC ONLY 1.5CC   Final   Culture  Setup Time     Final   Value: 04/18/2013 10:57     Performed at [facility]   Culture     Final   Value:        BLOOD CULTURE RECEIVED NO GROWTH TO DATE CULTURE WILL BE HELD FOR 5 DAYS BEFORE ISSUING A FINAL NEGATIVE REPORT     Performed at [facility]   Report Status PENDING   Incomplete  URINE CULTURE     Status: None   Collection Time    04/17/13 11:19 PM      Result Value Range Status   Specimen Description URINE, RANDOM   Final   Special Requests ADDED 2359   Final   Culture  Setup Time     Final   Value: 04/18/2013 00:34     Performed at [facility] Count     Final   Value: >=100,000 COLONIES/ML     Performed at [facility]   Culture     Final   Value: KLEBSIELLA OXYTOCA     Performed at [facility]   Report Status 04/19/2013 FINAL   Final   Organism ID, Bacteria KLEBSIELLA OXYTOCA   Final     Studies: Dg Chest 2 View  04/17/2013   CLINICAL DATA:  Pain. Altered mental status.  EXAM: CHEST  2 VIEW  COMPARISON:  Radiographs 12/02/2011. CT 03/03/2013.  FINDINGS: There is a moderate thoracic kyphosis. The bones are diffusely demineralized. Thoracolumbar compression deformities and deformity of the sternum are grossly unchanged. The heart size and mediastinal contours are stable with diffuse thoracic aortic ectasia. The lungs appear clear. There is no pleural effusion or pneumothorax.  Advanced glenohumeral degenerative changes are present on the left.  IMPRESSION: No acute cardiopulmonary  process identified.   Electronically Signed   By: Roxy Horseman   On: 04/17/2013 20:26   Dg Lumbar Spine Complete  04/17/2013   CLINICAL DATA:  Back pain.  Soft tissue ulcerations.  EXAM: LUMBAR SPINE - COMPLETE 4+ VIEW  COMPARISON:  CTs of the chest, abdomen and pelvis 03/03/2013.  FINDINGS: The bones are severely demineralized. There is a thoracolumbar kyphoscoliosis. Multiple compression deformities are grossly unchanged. Within the lumbar spine, these are demonstrated at L3, L4 and L5. No new fractures are identified. There is diffuse atherosclerosis.  IMPRESSION: No apparent change in multiple thoracolumbar compression deformities. Severe osteopenia.   Electronically Signed   By: Roxy Horseman   On: 04/17/2013 20:29    Scheduled Meds: . aspirin  81 mg Oral Daily  . busPIRone  15 mg Oral BID  . enoxaparin (LOVENOX) injection  30 mg Subcutaneous Q24H  . feeding supplement  237 mL Oral BID BM  . levothyroxine  50 mcg Oral QAC breakfast  . memantine  10 mg Oral Daily   And  . memantine  5 mg Oral QHS  . multivitamin with minerals  1 tablet Oral Daily  . piperacillin-tazobactam (ZOSYN)  IV  3.375 g Intravenous Q8H  . vancomycin  500 mg Intravenous Q24H   Continuous Infusions: . sodium chloride 100 mL/hr at 04/19/13 1135   Time spent: 35 min  Talibah Colasurdo L  [facility] Pager [phone]. If 7PM-7AM, please contact night-coverage at [email or web address], password [facility] 04/19/2013, 4:24 PM  LOS: 2 days

## 2013-04-19 NOTE — Progress Notes (Signed)
NUTRITION FOLLOW UP  Intervention:   1. Ensure Complete po BID, each supplement provides 350 kcal and 13 grams of protein.   NUTRITION DIAGNOSIS:  Malnutrition related to dementia as evidenced by severe fat and muscle wasting and 26% weight loss x 1 month; ongoing.   Goal:  Pt to meet >/= 90% of their estimated nutrition needs, not met.   Monitor:  PO intake, swallow ability, supplement acceptance  Assessment:   Pt admitted from home with multiple pressure ulcers. Pt with severe malnutrition in the context of chronic illness which was present on admission.  MD discussing plan of care with daughter.   Height: Ht Readings from Last 1 Encounters:  04/17/13 5\' 4"  (1.626 m)    Weight Status:   Wt Readings from Last 1 Encounters:  04/17/13 85 lb (38.556 kg)    Re-estimated needs:  Kcal: 1200-1400  Protein: 60-75 grams  Fluid: > 1.5 L/day  Skin:  Sacrum Unstageable: 6X4X.8cm.100% loose eschar hanging over wound, large amt tan drainage and strong foul odor.  Left trochanter Unstageable 3X8.5cm, 10% red, 90% slough, mod tan drainage, some strong odor.  Right trochanter Unstageable 3X3cm, 10% red, 90% slough, mod tan drainage, some strong odor.   Diet Order: Dysphagia 1 with Thin Liquids   Intake/Output Summary (Last 24 hours) at 04/19/13 0904 Last data filed at 04/19/13 0500  Gross per 24 hour  Intake      0 ml  Output    250 ml  Net   -250 ml    Last BM: 8/26   Labs:   Recent Labs Lab 04/17/13 1907 04/18/13 0630 04/19/13 0700  NA 151* 153* 150*  K 3.6 2.6* 3.6  CL 118* 121* 119*  CO2 22 24 22   BUN 29* 20 16  CREATININE 0.57 0.49* 0.49*  CALCIUM 11.0* 10.2 10.4  MG  --  2.1  --   GLUCOSE 165* 116* 99    CBG (last 3)   Recent Labs  04/18/13 1252 04/18/13 2149 04/19/13 0627  GLUCAP 94 165* 109*    Scheduled Meds: . aspirin  81 mg Oral Daily  . busPIRone  15 mg Oral BID  . enoxaparin (LOVENOX) injection  30 mg Subcutaneous Q24H  . feeding  supplement  237 mL Oral BID BM  . levothyroxine  50 mcg Oral QAC breakfast  . memantine  10 mg Oral Daily   And  . memantine  5 mg Oral QHS  . multivitamin with minerals  1 tablet Oral Daily  . piperacillin-tazobactam (ZOSYN)  IV  3.375 g Intravenous Q8H  . pneumococcal 23 valent vaccine  0.5 mL Intramuscular Tomorrow-1000  . vancomycin  500 mg Intravenous Q24H    Continuous Infusions: . sodium chloride 1,000 mL (04/17/13 2336)    Kendell Bane RD, LDN, CNSC [phone] Pager [phone] After Hours Pager

## 2013-04-19 NOTE — Progress Notes (Signed)
Clinical Social Work Department BRIEF PSYCHOSOCIAL ASSESSMENT 04/19/2013  Patient:  Michele Ward, Michele Ward     Account Number:  [id number]     Admit date:  04/17/2013  Clinical Social Worker:  Lourdes Sledge  Date/Time:  04/19/2013 09:20 AM  Referred by:  Physician  Date Referred:  04/19/2013 Referred for  SNF Placement   Other Referral:   Interview type:  Family Other interview type:   CSW received a call from pt daughter Roberts Gaudy [phone]    PSYCHOSOCIAL DATA Living Status:  FAMILY Admitted from facility:   Level of care:   Primary support name:  Leilanie Rauda [phone] Primary support relationship to patient:  CHILD, ADULT Degree of support available:   Pt lives with her daughter [name] who is pt caregiver.    CURRENT CONCERNS Current Concerns  Post-Acute Placement   Other Concerns:    SOCIAL WORK ASSESSMENT / PLAN CSW received a call from pt daughter [name] who informed CSW that she would like SNF placement for pt at [facility].    CSW informed daughter that PT/OT have not assessed pt however CSW could do a SNF search. CSW informed daughter that placement would be pending PT recommendations. Daughter very appreciative and stated that pt would only need [facility] SNF as would return to live with pt daughter [name].    CSW to do a SNF search and follow up on bed offers.   Assessment/plan status:  Psychosocial Support/Ongoing Assessment of Needs Other assessment/ plan:   Information/referral to community resources:   CSW to provide a SNF list.    PATIENT'S/FAMILY'S RESPONSE TO PLAN OF CARE: Pt presents with dementia. CSW completed assessment with pt daughter [name] as daughter contacted CSW on the phone.    CSW to remain following for potential SNF placement.       Theresia Bough, MSW, LCSW [phone]

## 2013-04-20 LAB — GLUCOSE, CAPILLARY
Glucose-Capillary: 72 mg/dL (ref 70–99)
Glucose-Capillary: 127 mg/dL — ABNORMAL HIGH (ref 70–99)

## 2013-04-20 LAB — FOLATE: Folate: 6.9 ng/mL

## 2013-04-20 MED ORDER — DEXTROSE 5 % IV SOLN
INTRAVENOUS | Status: DC
  Administered 2013-04-20 – 2013-04-21 (×2): via INTRAVENOUS

## 2013-04-20 NOTE — Progress Notes (Addendum)
Thank you for consulting the Palliative Medicine Team at [facility] to meet your patient's and family's needs.   The reason that you asked [place] to see your patient is for goals of care discussion  We have scheduled your patient for a meeting: tomorrow, Thursday 8/28 @ 8:30 am  The Surrogate decision maker is: daughter Karnisha Lefebre [phone]  Other family members that need to be present: none per [name] she is HCPOA and primary caregiver; she will inform her sister of any decisions  Your patient is unable to participate: r/t advancing Dementia  Additional Narrative: patient admitted from home with stage 4 sacral wound infection; multiple other pressure wounds; severe protein calorie malnutrition; FTT- PMH: Alzheimer's Dementia; bedbound; HTN; DM; writer spoke with daughter [name] at bedside who indicates for the last 3-6 months her mother has been spitting out medications and taking very little in by mouth; [name] shared that she has been caring for her mother for the last 7 years and acknowledges the severity of her decline; [name] related that her mother has been talking to her dead father and siblings and stated that she knows she is nearing the end and wants her to be comfortable yet in the same sentence asks what can be done to 'make her better' - observed patient wound care being done by staff - patient grimacing and some moaning with repositioning; [name] is open to continuing discussion with PMT provider to talk about what comfort is for her mom and next steps   Valente David, RN 04/20/2013, 5:56 PM Palliative Medicine Team RN Liaison [phone]

## 2013-04-20 NOTE — Evaluation (Signed)
Physical Therapy Evaluation Patient Details Name: Michele Ward MRN: [id number] DOB: June 05, 1921 Today's Date: 04/20/2013 Time: 0912-0936 PT Time Calculation (min): 24 min  PT Assessment / Plan / Recommendation History of Present Illness  HPI: Michele Ward is a 79 y.o. female with history of Alzheimer's dementia, bedridden, diabetes, hypertension, is cared for at home by her daughter, who provides all the history.  Clinical Impression  Admitted with above; Presents to PT with dependence in functional mobility and ADLs, decr ROM throughout, decr skin integrity; Will benefit from PT (during acute stay and at SNF) to maximize function, educate daughter for positioning, skin integrity, providing ROM therex, and decreasing burden of care at next venue.    PT Assessment  Patient needs continued PT services    Follow Up Recommendations  SNF;Supervision/Assistance - 24 hour    Does the patient have the potential to tolerate intense rehabilitation      Barriers to Discharge        Equipment Recommendations  None recommended by PT    Recommendations for Other Services Other (comment) (Palliative Medicine consult)   Frequency Min 3X/week    Precautions / Restrictions Precautions Precautions: Fall;Other (comment) (Skin integrity)   Pertinent Vitals/Pain No severuty noted, but noted grimace with ROM      Mobility  Bed Mobility Bed Mobility: Supine to Sit;Sit to Supine Supine to Sit: 1: +2 Total assist;HOB elevated Supine to Sit: Patient Percentage: 10% Sit to Supine: 1: +2 Total assist Sit to Supine: Patient Percentage: 10% Details for Bed Mobility Assistance: Pt agreeable to sit up -- did not push against movement or try to lay back down; Used +2 assist for pt's comfort -- opted not to rolland put pressure on either R or L  trochanter, so total assist to elevate trunk off bed and move LE's off of bed Transfers Transfers: Not assessed    Exercises Low Level/ICU  Exercises Ankle Circles/Pumps: PROM;Right;Left;Limitations Ankle Circles/Pumps Limitations: Unable to dorsiflex past neutral; grimace  Heel Slides: PROM;Both;5 reps Shoulder Flexion: AAROM;Both;5 reps (Limitations L shoulder flexion) Elbow Flexion: AAROM;Both;5 reps   PT Diagnosis: Acute pain;Generalized weakness  PT Problem List: Decreased strength;Decreased range of motion;Decreased activity tolerance;Decreased balance;Decreased mobility;Decreased coordination;Decreased cognition;Decreased skin integrity;Pain PT Treatment Interventions: Functional mobility training;Therapeutic activities;Therapeutic exercise;Balance training;Neuromuscular re-education;Patient/family education     PT Goals(Current goals can be found in the care plan section) Acute Rehab PT Goals Patient Stated Goal: Unable to state PT Goal Formulation: With patient/family Time For Goal Achievement: 04/27/13 Potential to Achieve Goals: Fair  Visit Information  Last PT Received On: 04/20/13 Assistance Needed: +1 (+1 ROM, +2 transfers, and to help with hygeine) History of Present Illness: HPI: Michele Ward is a 79 y.o. female with history of Alzheimer's dementia, bedridden, diabetes, hypertension, is cared for at home by her daughter, who provides all the history.       Prior Functioning  Home Living Family/patient expects to be discharged to:: Skilled nursing facility Living Arrangements: Children Additional Comments: Pt had been mostly bedridden and dependent for bed mobility and transfers at home, daugther states whe provided extensive assist for transfers to wheelchair Prior Function Level of Independence: Needs assistance Gait / Transfers Assistance Needed: Non-ambulatory; max/?total assist with transfers Communication Communication: Other (comment) (h/o dementia) Dominant Hand: Right    Cognition  Cognition Arousal/Alertness: Awake/alert Behavior During Therapy: WFL for tasks  assessed/performed Overall Cognitive Status: History of cognitive impairments - at baseline    Extremity/Trunk Assessment Upper Extremity Assessment Upper Extremity Assessment: Difficult to assess due  to impaired cognition (Noted limitations in L shoulder flexion range) Lower Extremity Assessment Lower Extremity Assessment: RLE deficits/detail;LLE deficits/detail;Difficult to assess due to impaired cognition RLE Deficits / Details: Noted limitations in ankle dorsiflexion, hip and knee flexion, and hip stiffness with rotation, with grimace with PROM; Minimal, if any noted voluntary movement R or LLEs RLE: Unable to fully assess due to pain LLE Deficits / Details: Noted limitations in ankle dorsiflexion, hip and knee flexion, and hip stiffness with rotation, with grimace with PROM; Minimal, if any noted voluntary movement R or LLEs LLE: Unable to fully assess due to pain Cervical / Trunk Assessment Cervical / Trunk Assessment: Kyphotic (stiffness throughout trunk and neck)   Balance Balance Balance Assessed: Yes Static Sitting Balance Static Sitting - Balance Support: Bilateral upper extremity supported;Feet supported Static Sitting - Level of Assistance: 2: Max assist Static Sitting - Comment/# of Minutes: Sat EOB approx 8-10 minutes with max support; Pt did Use R hand on therapist's shoulder for some support; Used the act of hugging daughter to encourage some weight shift forward  End of Session PT - End of Session Activity Tolerance: Patient tolerated treatment well Patient left: in bed;with call bell/phone within reach;with family/visitor present (bed in semi-chair position) Nurse Communication: Mobility status  GP     Olen Pel [place], [place] [phone]  04/20/2013, 10:59 AM

## 2013-04-20 NOTE — Progress Notes (Signed)
Clinical Social Worker (CSW) spoke with pt daughter [name] regarding bed offers. Daughter to visit [facility] today. CSW to remain following.  Theresia Bough, MSW, LCSW [phone]

## 2013-04-20 NOTE — Progress Notes (Signed)
[facility] PROGRESS NOTE  Krysta Bloomfield ZOX:[id number] DOB: 1920-10-14 DOA: 04/17/2013 PCP: Laurena Slimmer, MD  Brief narrative 79 y.o. female with history of Alzheimer's dementia, bedridden, diabetes, hypertension, is cared for at home by her daughter,  Brought to ED for worsening skin breakdown and foul drainage over her buttocks. It started with  skin breakdown on her sacrum, 2-3 weeks ago. Daughter Took her to see Dr. Chestine Spore and started applying zinc oxide, without any benefit.  Now the wounds with increased drainage and foul odor, also increased lethargy and poor by mouth intake for the last week.  Upon evaluation the emergency room noted to have multiple sacral decubitus one of which is stage IV, white count of 19,000, hypernatremia.  Assessment/Plan: . Wound Infection/Sacral decubitus  -stage 4 and smaller stage 2 wounds  -empiric  IV Abx-Vanc/Zosyn  -Wound care RN consulted. Has severe malnutrition. Added supplements - monitor wbc  Hypernatremia  -secondary to dehydration. switch fluids to D5 water. Add free water by mouth   UTI  cx growing klebsiella  foley placed on admission. Current abx should cover. follow sensitivity  Dementia  -continue namenda   DM  -hold starlix  -SSI   Hypothyroidism  -continue synthroid   .   Hypercalcemia:  - secondary to dehydration  -corrected with IV fluids  7. severe protein calorie malnutrition  -Albumin 2.2  -Nutrition consult   DVT prophylaxis with Lovenox  Code Status: Full Code per daughter .palliaive care consulted given severe FTT and malnutrition Family Communication: daughter at bedside Disposition Plan: likely SNF. palliative care consulted   Consultants:  Wound care nurse  Procedures:  none  Antibiotics:  IV vanco   HPI/Subjective: Patient seen and examined this am.apteint afebrile but continues to have poor po intake  Objective: Filed Vitals:   04/20/13 1300  BP: 126/70  Pulse: 88   Temp: 97.6 F (36.4 C)  Resp: 20    Intake/Output Summary (Last 24 hours) at 04/20/13 1612 Last data filed at 04/20/13 1300  Gross per 24 hour  Intake   1380 ml  Output   1100 ml  Net    280 ml   Filed Weights   04/17/13 2118  Weight: 38.556 kg (85 lb)    Exam: General: Cachectic elderly female . confused  Cardiovascular: NS1&S2, no murmurs Respiratory: clear b/l Abdomen: Soft, NT,ND, BS+,foley in place Skin: 3-4 cm sacral decubitus with purulent drainage, foul odor. bilateral trochanter decubiti with unstageable. Right back with stage 2 pressure ulcer .severemuscle wasting. CNS;AAOx1, demented    Data Reviewed: Basic Metabolic Panel:  Recent Labs Lab 04/17/13 1907 04/18/13 0630 04/19/13 0700  NA 151* 153* 150*  K 3.6 2.6* 3.6  CL 118* 121* 119*  CO2 22 24 22   GLUCOSE 165* 116* 99  BUN 29* 20 16  CREATININE 0.57 0.49* 0.49*  CALCIUM 11.0* 10.2 10.4  MG  --  2.1  --    Liver Function Tests:  Recent Labs Lab 04/17/13 1907 04/19/13 0700  AST 17 18  ALT 10 12  ALKPHOS 79 98  BILITOT 0.3 0.2*  PROT 6.1 5.8*  ALBUMIN 2.2* 1.9*   No results found for this basename: LIPASE, AMYLASE,  in the last 168 hours No results found for this basename: AMMONIA,  in the last 168 hours CBC:  Recent Labs Lab 04/17/13 1907 04/18/13 0630 04/19/13 0700  WBC 19.4* 20.1* 16.2*  NEUTROABS 17.2*  --   --   HGB 10.4* 9.0* 9.6*  HCT 30.8* 27.4* 28.3*  MCV 92.8 91.9 89.3  PLT 213 273 256   Cardiac Enzymes: No results found for this basename: CKTOTAL, CKMB, CKMBINDEX, TROPONINI,  in the last 168 hours BNP (last 3 results) No results found for this basename: PROBNP,  in the last 8760 hours CBG:  Recent Labs Lab 04/19/13 1128 04/19/13 1625 04/19/13 2141 04/20/13 0635 04/20/13 1142  GLUCAP 107* 94 81 72 127*    Recent Results (from the past 240 hour(s))  WOUND CULTURE     Status: None   Collection Time    04/17/13 10:01 PM      Result Value Range Status    Specimen Description WOUND BUTTOCKS   Final   Special Requests NONE   Final   Gram Stain     Final   Value: MODERATE WBC PRESENT,BOTH PMN AND MONONUCLEAR     NO SQUAMOUS EPITHELIAL CELLS SEEN     MODERATE GRAM NEGATIVE RODS     FEW GRAM POSITIVE COCCI IN PAIRS     Performed at [facility]   Culture     Final   Value: RARE STAPHYLOCOCCUS AUREUS     Note: RIFAMPIN AND GENTAMICIN SHOULD NOT BE USED AS SINGLE DRUGS FOR TREATMENT OF STAPH INFECTIONS.     Performed at [facility]   Report Status PENDING   Incomplete  CULTURE, BLOOD (ROUTINE X 2)     Status: None   Collection Time    04/17/13 10:15 PM      Result Value Range Status   Specimen Description BLOOD LEFT HAND   Final   Special Requests BOTTLES DRAWN AEROBIC ONLY 5CC   Final   Culture  Setup Time     Final   Value: 04/18/2013 10:57     Performed at [facility]   Culture     Final   Value:        BLOOD CULTURE RECEIVED NO GROWTH TO DATE CULTURE WILL BE HELD FOR 5 DAYS BEFORE ISSUING A FINAL NEGATIVE REPORT     Performed at [facility]   Report Status PENDING   Incomplete  CULTURE, BLOOD (ROUTINE X 2)     Status: None   Collection Time    04/17/13 10:30 PM      Result Value Range Status   Specimen Description BLOOD LEFT HAND   Final   Special Requests BOTTLES DRAWN AEROBIC ONLY 1.5CC   Final   Culture  Setup Time     Final   Value: 04/18/2013 10:57     Performed at [facility]   Culture     Final   Value:        BLOOD CULTURE RECEIVED NO GROWTH TO DATE CULTURE WILL BE HELD FOR 5 DAYS BEFORE ISSUING A FINAL NEGATIVE REPORT     Performed at [facility]   Report Status PENDING   Incomplete  URINE CULTURE     Status: None   Collection Time    04/17/13 11:19 PM      Result Value Range Status   Specimen Description URINE, RANDOM   Final   Special Requests ADDED 2359   Final   Culture  Setup Time     Final   Value: 04/18/2013 00:34     Performed at [facility]     Final   Value: >=100,000 COLONIES/ML     Performed at [facility]   Culture     Final  Value: KLEBSIELLA OXYTOCA     Performed at [facility]   Report Status 04/19/2013 FINAL   Final   Organism ID, Bacteria KLEBSIELLA OXYTOCA   Final     Studies: No results found.  Scheduled Meds: . aspirin  81 mg Oral Daily  . busPIRone  15 mg Oral BID  . enoxaparin (LOVENOX) injection  30 mg Subcutaneous Q24H  . feeding supplement  237 mL Oral BID BM  . levothyroxine  50 mcg Oral QAC breakfast  . memantine  10 mg Oral Daily   And  . memantine  5 mg Oral QHS  . multivitamin with minerals  1 tablet Oral Daily  . piperacillin-tazobactam (ZOSYN)  IV  3.375 g Intravenous Q8H  . vancomycin  500 mg Intravenous Q24H   Continuous Infusions: . dextrose 75 mL/hr at 04/20/13 1003      Time spent: 25 minutes    Undray Allman  [facility] Pager [phone] If 7PM-7AM, please contact night-coverage at [email or web address], password [facility] 04/20/2013, 4:12 PM  LOS: 3 days

## 2013-04-21 DIAGNOSIS — R5381 Other malaise: Secondary | ICD-10-CM

## 2013-04-21 DIAGNOSIS — R531 Weakness: Secondary | ICD-10-CM

## 2013-04-21 DIAGNOSIS — R627 Adult failure to thrive: Secondary | ICD-10-CM | POA: Diagnosis present

## 2013-04-21 DIAGNOSIS — Z515 Encounter for palliative care: Secondary | ICD-10-CM

## 2013-04-21 LAB — BASIC METABOLIC PANEL
BUN: 11 mg/dL (ref 6–23)
CO2: 21 mEq/L (ref 19–32)
Chloride: 109 mEq/L (ref 96–112)
Creatinine, Ser: 0.45 mg/dL — ABNORMAL LOW (ref 0.50–1.10)
Glucose, Bld: 122 mg/dL — ABNORMAL HIGH (ref 70–99)

## 2013-04-21 LAB — CBC
HCT: 27.9 % — ABNORMAL LOW (ref 36.0–46.0)
MCHC: 34.1 g/dL (ref 30.0–36.0)
MCV: 89.4 fL (ref 78.0–100.0)
RDW: 14.7 % (ref 11.5–15.5)
WBC: 18.2 10*3/uL — ABNORMAL HIGH (ref 4.0–10.5)

## 2013-04-21 LAB — GLUCOSE, CAPILLARY: Glucose-Capillary: 162 mg/dL — ABNORMAL HIGH (ref 70–99)

## 2013-04-21 LAB — MAGNESIUM: Magnesium: 1.9 mg/dL (ref 1.5–2.5)

## 2013-04-21 LAB — WOUND CULTURE

## 2013-04-21 MED ORDER — MORPHINE SULFATE (CONCENTRATE) 10 MG /0.5 ML PO SOLN: 5.0000 mg | ORAL | Status: DC | PRN

## 2013-04-21 MED ORDER — POTASSIUM CHLORIDE CRYS ER 20 MEQ PO TBCR
40.0000 meq | EXTENDED_RELEASE_TABLET | ORAL | Status: AC
  Administered 2013-04-21 (×2): 40 meq via ORAL
  Filled 2013-04-21 (×2): qty 2

## 2013-04-21 MED ORDER — BISACODYL 10 MG RE SUPP: 10.0000 mg | Freq: Every day | RECTAL | Status: DC | PRN

## 2013-04-21 MED ORDER — SULFAMETHOXAZOLE-TRIMETHOPRIM 400-80 MG PO TABS
1.0000 | ORAL_TABLET | Freq: Two times a day (BID) | ORAL | Status: DC
  Administered 2013-04-21 – 2013-04-22 (×2): 1 via ORAL
  Filled 2013-04-21 (×3): qty 1

## 2013-04-21 NOTE — Progress Notes (Addendum)
[facility] PROGRESS NOTE  Dominiqua Cooner ZOX:[id number] DOB: 09/01/1920 DOA: 04/17/2013 PCP: Laurena Slimmer, MD  Brief narrative  79 y.o. female with history of Alzheimer's dementia, bedridden, diabetes, hypertension, is cared for at home by her daughter,  Brought to ED for worsening skin breakdown and foul drainage over her buttocks. It started with skin breakdown on her sacrum, 2-3 weeks ago. Daughter Took her to see Dr. Chestine Spore and started applying zinc oxide, without any benefit.  Now the wounds with increased drainage and foul odor, also increased lethargy and poor by mouth intake for the last week.  Upon evaluation the emergency room noted to have multiple sacral decubitus one of which is stage IV, white count of 19,000, hypernatremia.   Assessment/Plan:  . Wound Infection/Sacral decubitus  -stage 4 and smaller stage 2 wounds  -empiric IV Abx-Vanc/Zosyn  -Wound care RN consulted. Has severe malnutrition. Added supplements  - has persistent leucocytosis.  Hypernatremia  -secondary to dehydration. switch fluids to D5 water. Add free water by mouth . Na improved today  UTI  cx growing klebsiella  foley placed on admission. Current abx should cover. Mostly sensitive.  Hypokalemia  replenishing with po kcl   Dementia  -continue namenda   DM  -hold starlix  -SSI   Hypothyroidism  -continue synthroid  .  Hypercalcemia - secondary to dehydration  -corrected with IV fluids    severe protein calorie malnutrition  -Albumin 2.2  -Nutrition consult   DVT prophylaxis with Lovenox   Code Status:  palliative care consulted given poor improvement and severe deconditioning. Daughter wishes her to be comfortable and doesnot want any aggressive treatment or rehospitalization. wishes to take her home with home hospice.  CM aware. Plan to discharge her home with Home hospice on 8/29  Family Communication: daughter at bedside  Disposition Plan: home with hospice on  8/29 Consultants:  Wound care nurse Procedures:  none Antibiotics:  IV vanco     Objective: Filed Vitals:   04/21/13 0612  BP: 158/76  Pulse: 90  Temp: 97.5 F (36.4 C)  Resp: 18    Intake/Output Summary (Last 24 hours) at 04/21/13 1315 Last data filed at 04/21/13 0610  Gross per 24 hour  Intake 1803.33 ml  Output    700 ml  Net 1103.33 ml   Filed Weights   04/17/13 2118  Weight: 38.556 kg (85 lb)    Exam: General: Cachectic elderly female . confused  Cardiovascular: NS1&S2, no murmurs  Respiratory: clear b/l  Abdomen: Soft, NT,ND, BS+,foley in place Skin: 3-4 cm sacral decubitus with purulent drainage, foul odor. bilateral trochanter decubiti. Right back with stage 2 pressure ulcer .severemuscle wasting.  CNS;AAOx1, demented  Data Reviewed: Basic Metabolic Panel:  Recent Labs Lab 04/17/13 1907 04/18/13 0630 04/19/13 0700 04/21/13 0620 04/21/13 0800  NA 151* 153* 150* 140  --   K 3.6 2.6* 3.6 2.6*  --   CL 118* 121* 119* 109  --   CO2 22 24 22 21   --   GLUCOSE 165* 116* 99 122*  --   BUN 29* 20 16 11   --   CREATININE 0.57 0.49* 0.49* 0.45*  --   CALCIUM 11.0* 10.2 10.4 9.8  --   MG  --  2.1  --   --  1.9   Liver Function Tests:  Recent Labs Lab 04/17/13 1907 04/19/13 0700  AST 17 18  ALT 10 12  ALKPHOS 79 98  BILITOT 0.3 0.2*  PROT 6.1 5.8*  ALBUMIN 2.2*  1.9*   No results found for this basename: LIPASE, AMYLASE,  in the last 168 hours No results found for this basename: AMMONIA,  in the last 168 hours CBC:  Recent Labs Lab 04/17/13 1907 04/18/13 0630 04/19/13 0700 04/21/13 0620  WBC 19.4* 20.1* 16.2* 18.2*  NEUTROABS 17.2*  --   --   --   HGB 10.4* 9.0* 9.6* 9.5*  HCT 30.8* 27.4* 28.3* 27.9*  MCV 92.8 91.9 89.3 89.4  PLT 213 273 256 252   Cardiac Enzymes: No results found for this basename: CKTOTAL, CKMB, CKMBINDEX, TROPONINI,  in the last 168 hours BNP (last 3 results) No results found for this basename: PROBNP,  in the  last 8760 hours CBG:  Recent Labs Lab 04/20/13 1142 04/20/13 1644 04/20/13 2111 04/21/13 0641 04/21/13 1113  GLUCAP 127* 126* 147* 136* 164*    Recent Results (from the past 240 hour(s))  WOUND CULTURE     Status: None   Collection Time    04/17/13 10:01 PM      Result Value Range Status   Specimen Description WOUND BUTTOCKS   Final   Special Requests NONE   Final   Gram Stain     Final   Value: MODERATE WBC PRESENT,BOTH PMN AND MONONUCLEAR     NO SQUAMOUS EPITHELIAL CELLS SEEN     MODERATE GRAM NEGATIVE RODS     FEW GRAM POSITIVE COCCI IN PAIRS     Performed at [facility]   Culture     Final   Value: RARE STAPHYLOCOCCUS AUREUS     Note: RIFAMPIN AND GENTAMICIN SHOULD NOT BE USED AS SINGLE DRUGS FOR TREATMENT OF STAPH INFECTIONS.     Performed at [facility]   Report Status 04/21/2013 FINAL   Final   Organism ID, Bacteria STAPHYLOCOCCUS AUREUS   Final  CULTURE, BLOOD (ROUTINE X 2)     Status: None   Collection Time    04/17/13 10:15 PM      Result Value Range Status   Specimen Description BLOOD LEFT HAND   Final   Special Requests BOTTLES DRAWN AEROBIC ONLY 5CC   Final   Culture  Setup Time     Final   Value: 04/18/2013 10:57     Performed at [facility]   Culture     Final   Value:        BLOOD CULTURE RECEIVED NO GROWTH TO DATE CULTURE WILL BE HELD FOR 5 DAYS BEFORE ISSUING A FINAL NEGATIVE REPORT     Performed at [facility]   Report Status PENDING   Incomplete  CULTURE, BLOOD (ROUTINE X 2)     Status: None   Collection Time    04/17/13 10:30 PM      Result Value Range Status   Specimen Description BLOOD LEFT HAND   Final   Special Requests BOTTLES DRAWN AEROBIC ONLY 1.5CC   Final   Culture  Setup Time     Final   Value: 04/18/2013 10:57     Performed at [facility]   Culture     Final   Value:        BLOOD CULTURE RECEIVED NO GROWTH TO DATE CULTURE WILL BE HELD FOR 5 DAYS BEFORE ISSUING A FINAL NEGATIVE REPORT      Performed at [facility]   Report Status PENDING   Incomplete  URINE CULTURE     Status: None   Collection Time    04/17/13  11:19 PM      Result Value Range Status   Specimen Description URINE, RANDOM   Final   Special Requests ADDED 2359   Final   Culture  Setup Time     Final   Value: 04/18/2013 00:34     Performed at [facility]   Colony Count     Final   Value: >=100,000 COLONIES/ML     Performed at [facility]   Culture     Final   Value: KLEBSIELLA OXYTOCA     Performed at [facility]   Report Status 04/19/2013 FINAL   Final   Organism ID, Bacteria KLEBSIELLA OXYTOCA   Final     Studies: No results found.  Scheduled Meds: . aspirin  81 mg Oral Daily  . busPIRone  15 mg Oral BID  . enoxaparin (LOVENOX) injection  30 mg Subcutaneous Q24H  . feeding supplement  237 mL Oral BID BM  . levothyroxine  50 mcg Oral QAC breakfast  . memantine  10 mg Oral Daily   And  . memantine  5 mg Oral QHS  . multivitamin with minerals  1 tablet Oral Daily  . piperacillin-tazobactam (ZOSYN)  IV  3.375 g Intravenous Q8H  . potassium chloride  40 mEq Oral Q4H  . vancomycin  500 mg Intravenous Q24H   Continuous Infusions: . dextrose 75 mL/hr at 04/20/13 1003      Time spent: 25 MINUTES    Raynesha Tiedt  [facility] Pager [phone]. If 7PM-7AM, please contact night-coverage at [email or web address], password [facility] 04/21/2013, 1:15 PM  LOS: 4 days

## 2013-04-21 NOTE — Progress Notes (Signed)
Notified by Jacquelynn Cree CMRN, patient and family request services of [facility]) after discharge.   Spoke with daughter Michele Ward at bedside to initiate education related to hospice services, philosophy and team approach to care - Michele Ward voiced good understanding of information provided. Daughter requests Foley catheter be left in place at d/c for comfort -Per discussion plan is to d/c once arrangements are in place-  - Michele Ward informed that the family has a [employer]chant [employer] officer and prefers to transport patient to home via personal vehicle  - Michele Ward informed they have a hospital bed in the home that was given by a family member to patient -reports this is old and would like to replace with the following DME:  fully electric hospital bed with full rails and AP&P mattress  Per dtr Michele Ward she needs to contact family to take down current bed and be present for DME delivery  [facility] representative [place], and [facility] aware * please have [facility] contact dtr Michele Ward in room [phone] to arrange delivery time;  current address in epic is correct: [address]  and Home phone # [phone]   Initial paperwork faxed to [facility]  Completed d/c summary will need to be faxed to [facility] @ [phone] when final Please notify [facility] when patient is ready to leave unit at d/c call [phone] (or [phone] if after 5 pm);  [facility] information and contact numbers also given to daughter, Michele Ward  during visit.   Above information shared with [facility] Please call with any questions or concerns   Valente David, RN 04/21/2013, 12:42 PM [facility] Palliative Medicine Team RN Liaison [phone]

## 2013-04-21 NOTE — Consult Note (Signed)
Patient JY:NWGNFAOZ Penick      DOB: June 12, 1921      HYQ:[id number]     Consult Note from the Palliative Medicine Team at [facility]    Consult Requested by: Dr Gonzella Lex     PCP: Laurena Slimmer, MD Reason for Consultation:Clarification of GOC and options     Phone Number:[phone]  Assessment of patients Current state:  Overall failure to thrive secondary to ES dementia; bed bound, incontinent , total care, significant decubitus (infected), protein calorie malnutrition (albumin 1.9).  Family in process of clarifying GOC  Consult is for review of medical treatment options, clarification of goals of care and end of life issues, disposition and options, and symptom recommendation.  This NP Lorinda Creed reviewed medical records, received report from team, assessed the patient and then meet at the patient's bedside along with her daughter/main caregiver Lidia Clavijo # [phone]  to discuss diagnosis prognosis, GOC, EOL wishes disposition and options.   A detailed discussion was had today regarding advanced directives.  Concepts specific to code status, artifical feeding and hydration, continued IV antibiotics and rehospitalization was had.  The difference between a aggressive medical intervention path  and a palliative comfort care path for this patient at this time was had.  Values and goals of care important to patient and family were attempted to be elicited.  Concept of Hospice and Palliative Care were discussed  Natural trajectory and expectations at EOL were discussed.  Questions and concerns addressed.  Hard Choices booklet left for review. Family encouraged to call with questions or concerns.  PMT will continue to support holistically.  MOST form completed     Goals of Care: 1.  Code Status: DNR/DNI-comfort is main focus of care   2. Scope of Treatment: 1. Vital Signs: per unit  2. Respiratory/Oxygen:as needed for comfort 3. Nutritional Support/Tube Feeds:no  artificial feeding now or in the future 4. Antibiotics: yes until dicharge and would consider oral agents 5. IVF: until discharge and then transition to oral agents as toelrated 6. Review of Medications to be discontinued: continue present medciations 7. Labs:until discahrge 8. Telemetry: none 9. Consults:none   4. Disposition: Hopeful to dc home with hospice services, I believe this patient will benefit from hospice benefit with a prognosis of six months or less   3. Symptom Management:   1. Pain/Dyspnea: Roxanol 5 mg every 3 hrs prn 2. Bowel Regimen: Dulcolax supp prn 3. Adult failure to thrive/weakness  4. Psychosocial:  Emotional support offered to family at bedside, daughter has been main caregiver for past seven years.  She understands the overall poor prognosis    Patient Documents Completed or Given: Document Given Completed  Advanced Directives Pkt    MOST yes   DNR    Gone from My Sight  yes  Hard Choices  yes    Brief HPI:79 y.o. female with history of Alzheimer's dementia, bedridden, diabetes, hypertension, is cared for at home by her daughter,  Brought to ED for worsening skin breakdown and foul drainage over her buttocks. It started with skin breakdown on her sacrum, 2-3 weeks ago. Daughter Took her to see Dr. Chestine Spore and started applying zinc oxide, without any benefit.  Now the wounds with increased drainage and foul odor, also increased lethargy and poor by mouth intake for the last week.  Upon evaluation the emergency room noted to have multiple sacral decubitus one of which is stage IV, white count of 19,000, hypernatremia.     ROS: unable to illicit  PMH:  Past Medical History  Diagnosis Date  . Hypertension   . Diabetes mellitus   . Alzheimer's dementia   . Hyperthyroidism      GNF:AOZHYQM reviewed. No pertinent past surgical history. I have reviewed the FH and SH and  If appropriate update it with new information. No Known  Allergies Scheduled Meds: . aspirin  81 mg Oral Daily  . busPIRone  15 mg Oral BID  . enoxaparin (LOVENOX) injection  30 mg Subcutaneous Q24H  . feeding supplement  237 mL Oral BID BM  . levothyroxine  50 mcg Oral QAC breakfast  . memantine  10 mg Oral Daily   And  . memantine  5 mg Oral QHS  . multivitamin with minerals  1 tablet Oral Daily  . piperacillin-tazobactam (ZOSYN)  IV  3.375 g Intravenous Q8H  . potassium chloride  40 mEq Oral Q4H  . vancomycin  500 mg Intravenous Q24H   Continuous Infusions: . dextrose 75 mL/hr at 04/20/13 1003   PRN Meds:.acetaminophen, morphine CONCENTRATE, ondansetron (ZOFRAN) IV, zolpidem    BP 158/76  Pulse 90  Temp(Src) 97.5 F (36.4 C) (Oral)  Resp 18  Ht 5\' 4"  (1.626 m)  Wt 38.556 kg (85 lb)  BMI 14.58 kg/m2  SpO2 98%   PPS:20 %   Intake/Output Summary (Last 24 hours) at 04/21/13 1127 Last data filed at 04/21/13 0610  Gross per 24 hour  Intake 1903.33 ml  Output    700 ml  Net 1203.33 ml    Physical Exam:  General: cachectic, ill chronically appearing elderly female HEENT:  + temporal and facial muscle wasting Chest:   diminished in bases, CTA CVS: RRR Abdomen: soft NT +BS, concave Ext:  Without edema Skin: noted documentation of decubitis Neuro: confused, garbled speech  Labs: CBC    Component Value Date/Time   WBC 18.2* 04/21/2013 0620   RBC 3.12* 04/21/2013 0620   HGB 9.5* 04/21/2013 0620   HCT 27.9* 04/21/2013 0620   PLT 252 04/21/2013 0620   MCV 89.4 04/21/2013 0620   MCH 30.4 04/21/2013 0620   MCHC 34.1 04/21/2013 0620   RDW 14.7 04/21/2013 0620   LYMPHSABS 1.0 04/17/2013 1907   MONOABS 1.1* 04/17/2013 1907   EOSABS 0.0 04/17/2013 1907   BASOSABS 0.0 04/17/2013 1907    BMET    Component Value Date/Time   NA 140 04/21/2013 0620   K 2.6* 04/21/2013 0620   CL 109 04/21/2013 0620   CO2 21 04/21/2013 0620   GLUCOSE 122* 04/21/2013 0620   BUN 11 04/21/2013 0620   CREATININE 0.45* 04/21/2013 0620   CALCIUM 9.8  04/21/2013 0620   GFRNONAA 86* 04/21/2013 0620   GFRAA >90 04/21/2013 0620    CMP     Component Value Date/Time   NA 140 04/21/2013 0620   K 2.6* 04/21/2013 0620   CL 109 04/21/2013 0620   CO2 21 04/21/2013 0620   GLUCOSE 122* 04/21/2013 0620   BUN 11 04/21/2013 0620   CREATININE 0.45* 04/21/2013 0620   CALCIUM 9.8 04/21/2013 0620   PROT 5.8* 04/19/2013 0700   ALBUMIN 1.9* 04/19/2013 0700   AST 18 04/19/2013 0700   ALT 12 04/19/2013 0700   ALKPHOS 98 04/19/2013 0700   BILITOT 0.2* 04/19/2013 0700   GFRNONAA 86* 04/21/2013 0620   GFRAA >90 04/21/2013 0620      Time In Time Out Total Time Spent with Patient Total Overall Time  0830 0945 70 min 75 min    Greater than  50%  of this time was spent counseling and coordinating care related to the above assessment and plan.  Lorinda Creed NP  Palliative Medicine Team Team Phone # [phone] Pager [phone]  Discussed with Dr Gonzella Lex

## 2013-04-21 NOTE — Progress Notes (Signed)
Palliative Medicine Team SW Pt discussed in PMT rounds, referred for psychosocial support. Extensive supportive visit with pt and dtr at bedside. Pt awake, in good spirits, confused at baseline. Dtr very doting, involved in pt's personal care. Dtr described pt as a "big personality" and expressed her gratitude for pt's continued good sense of humor. Dtr reflected on the pride she feels in her caregiver responsibilities and her good coping. Dtr relayed family's delight in being able to give pt a 89th birthday party last month. Family well supported with family ministers and reports plans to go home with hospice services upon d/c, likely tomorrow per chart. Affirmed dtr's self-care and provided my contact info as needs arise. Will follow up.   Michele Francois, LCSW PMT Phone [phone] Pager [phone]

## 2013-04-22 DIAGNOSIS — R6510 Systemic inflammatory response syndrome (SIRS) of non-infectious origin without acute organ dysfunction: Secondary | ICD-10-CM | POA: Diagnosis present

## 2013-04-22 DIAGNOSIS — N390 Urinary tract infection, site not specified: Secondary | ICD-10-CM | POA: Diagnosis present

## 2013-04-22 LAB — GLUCOSE, CAPILLARY: Glucose-Capillary: 122 mg/dL — ABNORMAL HIGH (ref 70–99)

## 2013-04-22 MED ORDER — SULFAMETHOXAZOLE-TRIMETHOPRIM 400-80 MG PO TABS: 1.0000 | ORAL_TABLET | Freq: Two times a day (BID) | ORAL | Status: AC

## 2013-04-22 MED ORDER — ENSURE COMPLETE PO LIQD: 237.0000 mL | Freq: Two times a day (BID) | ORAL | Status: AC

## 2013-04-22 MED ORDER — MORPHINE SULFATE (CONCENTRATE) 10 MG /0.5 ML PO SOLN: 5.0000 mg | ORAL | Status: AC | PRN

## 2013-04-22 MED ORDER — BISACODYL 10 MG RE SUPP: 10.0000 mg | Freq: Every day | RECTAL | Status: AC | PRN

## 2013-04-22 NOTE — Progress Notes (Signed)
Physical Therapy Treatment Patient Details Name: Michele Ward MRN: [id number] DOB: 1921-05-18 Today's Date: 04/22/2013 Time: 4098-1191 PT Time Calculation (min): 24 min  PT Assessment / Plan / Recommendation  History of Present Illness HPI: Michele Ward is a 79 y.o. female with history of Alzheimer's dementia, bedridden, diabetes, hypertension, is cared for at home by her daughter, who provides all the history.   PT Comments   Meet with patient and caregiver to go over positioning and PROM exercises for caregiver to implement. Caregiver given multiple handouts on being a caregiver, PROM for UE/LE and postioning. Patient is planning to DC home today with hospice care. Caregiver very grateful for everyones help and education provided  Follow Up Recommendations  SNF;Supervision/Assistance - 24 hour     Does the patient have the potential to tolerate intense rehabilitation     Barriers to Discharge        Equipment Recommendations  None recommended by PT    Recommendations for Other Services    Frequency Min 3X/week   Progress towards PT Goals    Plan Current plan remains appropriate    Precautions / Restrictions Precautions Precautions: Fall;Other (comment) Precaution Comments: Patient daughet taught positioning   Pertinent Vitals/Pain no apparent distress    Mobility       Exercises     PT Diagnosis:    PT Problem List:   PT Treatment Interventions:     PT Goals (current goals can now be found in the care plan section)    Visit Information  Last PT Received On: 04/22/13 Assistance Needed: +2 (for any mobility) History of Present Illness: HPI: Michele Ward is a 79 y.o. female with history of Alzheimer's dementia, bedridden, diabetes, hypertension, is cared for at home by her daughter, who provides all the history.    Subjective Data      Cognition  Cognition Arousal/Alertness: Awake/alert Behavior During Therapy: WFL for tasks  assessed/performed Overall Cognitive Status: History of cognitive impairments - at baseline    Balance     End of Session     GP     Fredrich Birks 04/22/2013, 11:26 AM  04/22/2013 Fredrich Birks PTA [phone] pager [phone] office

## 2013-04-22 NOTE — Progress Notes (Signed)
Progress Note from the Palliative Medicine Team at [facility]  Subjective: patient is more alert today, trying to communicate, speech is garbled  -patient appears comfortable, daughter Michele Ward at bedsdie  --returned call to daughter Roberts Gaudy to answer questions and address concerns.  [name] tells me that her sioster Michele Ward is the main decision maker and has indeed been the main caregiver for her mother.  She expresses concern for her sister that she "must be tired"   A detailed discussion was had today regarding the  natural trajectory and expectations at EOL.  Concepts specific to code status, artifical feeding and hydration, continued IV antibiotics and rehospitalization was had.  The difference between a aggressive medical intervention path  and a palliative comfort care path for this patient at this time was had.  A detailed discussion regarding the natural trajectory of the terminal disease process of dementia was had.  Values and goals of care important to patient and family were attempted to be elicited.  Concept of Hospice and Palliative Care were discussed   Questions and concerns addressed.  [name] was introduced to the [employer] booklet as a resource of education     Objective: No Known Allergies Scheduled Meds: . aspirin  81 mg Oral Daily  . busPIRone  15 mg Oral BID  . enoxaparin (LOVENOX) injection  30 mg Subcutaneous Q24H  . feeding supplement  237 mL Oral BID BM  . levothyroxine  50 mcg Oral QAC breakfast  . memantine  10 mg Oral Daily   And  . memantine  5 mg Oral QHS  . multivitamin with minerals  1 tablet Oral Daily  . sulfamethoxazole-trimethoprim  1 tablet Oral Q12H   Continuous Infusions: . dextrose 75 mL/hr at 04/21/13 1801   PRN Meds:.acetaminophen, bisacodyl, morphine CONCENTRATE, ondansetron (ZOFRAN) IV, zolpidem  BP 130/65  Pulse 85  Temp(Src) 97.6 F (36.4 C) (Oral)  Resp 18  Ht 5\' 4"  (1.626 m)  Wt 38.556 kg (85 lb)  BMI 14.58 kg/m2   SpO2 100%   PPS:20 %  Pain Score: unable to communicate, appears comfortable   Intake/Output Summary (Last 24 hours) at 04/22/13 1332 Last data filed at 04/22/13 0645  Gross per 24 hour  Intake 1943.7 ml  Output      0 ml  Net 1943.7 ml       Physical Exam: General: cachectic, ill chronically appearing elderly female  HEENT: + temporal and facial muscle wasting  Chest: diminished in bases, CTA  CVS: RRR  Abdomen: soft NT +BS, concave  Ext: Without edema  Skin: noted documentation of decubitis  Neuro: confused, garbled speech   Labs: CBC    Component Value Date/Time   WBC 18.2* 04/21/2013 0620   RBC 3.12* 04/21/2013 0620   HGB 9.5* 04/21/2013 0620   HCT 27.9* 04/21/2013 0620   PLT 252 04/21/2013 0620   MCV 89.4 04/21/2013 0620   MCH 30.4 04/21/2013 0620   MCHC 34.1 04/21/2013 0620   RDW 14.7 04/21/2013 0620   LYMPHSABS 1.0 04/17/2013 1907   MONOABS 1.1* 04/17/2013 1907   EOSABS 0.0 04/17/2013 1907   BASOSABS 0.0 04/17/2013 1907    BMET    Component Value Date/Time   NA 140 04/21/2013 0620   K 2.6* 04/21/2013 0620   CL 109 04/21/2013 0620   CO2 21 04/21/2013 0620   GLUCOSE 122* 04/21/2013 0620   BUN 11 04/21/2013 0620   CREATININE 0.45* 04/21/2013 0620   CALCIUM 9.8 04/21/2013 0620   GFRNONAA  86* 04/21/2013 0620   GFRAA >90 04/21/2013 0620    CMP     Component Value Date/Time   NA 140 04/21/2013 0620   K 2.6* 04/21/2013 0620   CL 109 04/21/2013 0620   CO2 21 04/21/2013 0620   GLUCOSE 122* 04/21/2013 0620   BUN 11 04/21/2013 0620   CREATININE 0.45* 04/21/2013 0620   CALCIUM 9.8 04/21/2013 0620   PROT 5.8* 04/19/2013 0700   ALBUMIN 1.9* 04/19/2013 0700   AST 18 04/19/2013 0700   ALT 12 04/19/2013 0700   ALKPHOS 98 04/19/2013 0700   BILITOT 0.2* 04/19/2013 0700   GFRNONAA 86* 04/21/2013 0620   GFRAA >90 04/21/2013 0620     Assessment and Plan: 1. Code Status:DNR/DNI-comfort is main focus of care 2. Symptom Control: 1. Pain/Dyspnea: Roxanol 5 mg every 3 hrs prn 2. Bowel  Regimen: Dulcolax supp prn 3. Adult failure to thrive/weakness  3. Psycho/Social:  Emotional support to both daughters.  Continued support and education regarding overall poor prognosis.    4. Disposition   Home with hospice services for full comfort approach.    Patient Documents Completed or Given: Document Given Completed  Advanced Directives Pkt    MOST yes   DNR    Gone from My [facility]    Hard Choices yes     Lorinda Creed NP  Palliative Medicine Team Team Phone # [phone] Pager [phone]   1

## 2013-04-22 NOTE — Discharge Summary (Addendum)
Physician Discharge Summary  Palak Tercero ZHY:[id number] DOB: 1921/01/27 DOA: 04/17/2013  PCP: Laurena Slimmer, MD  Admit date: 04/17/2013 Discharge date: 04/22/2013  Time spent: 40 minutes  Recommendations for Outpatient Follow-up:  1. Home with hospice  Discharge Diagnoses:  Principal Problem:   SIRS with Decubitus ulcer of sacral region, stage 4, infected  Active Problems:   Severe protein-calorie malnutrition   Adult failure to thrive   Weakness generalized   Hypernatremia UTI   Dementia   Decubitus ulcers, multiple   Debility   Hypokalemia   Anemia   Palliative care encounter   Discharge Condition: poor  Diet recommendation: dysphagia level 1  Filed Weights   04/17/13 2118  Weight: 38.556 kg (85 lb)    History of present illness:  Please refer to admission H&P for details, but in brief, 79 y.o. female with history of Alzheimer's dementia, bedridden, diabetes, hypertension, is cared for at home by her daughter,  Brought to ED for worsening skin breakdown and foul drainage over her buttocks. It started with skin breakdown on her sacrum, 2-3 weeks ago. Daughter Took her to see Dr. Chestine Spore and started applying zinc oxide, without any benefit.  Now the wounds with increased drainage and foul odor, also increased lethargy and poor by mouth intake for the last week.  Upon evaluation the emergency room noted to have multiple sacral decubitus one of which is stage IV, white count of 19,000, hypernatremia.    Hospital Course:  SIRS with Wound Infection/Sacral decubitus  -stage 4 and smaller stage 2 wounds  -empiric IV Abx-Vanc/Zosyn  -Wound care RN consulted. Has severe malnutrition. Added supplements  - has persistent leucocytosis. - wound culture grew MSSA, abx switched to oral bactrim on 8/28. Plan to treat for 2 weeks. -Patient now being discharged home with hospice. Added po  roxanol prn for pain and comfort.  Hypernatremia  -secondary to dehydration. Now  improved with fluids.  UTI  cx growing klebsiella  foley placed on admission. Pansensitive. Being discharged on bactrim  Hypokalemia  replenishing with po kcl   Dementia  -continue namenda   DM  D/c starlix  Hypothyroidism  -continue synthroid  .  Hypercalcemia  - secondary to dehydration  -corrected with IV fluids   severe protein calorie malnutrition  -Albumin 2.2  -placed on nutritional supplements   Code Status:  palliative care consulted given poor improvement and severe deconditioning and malnutrition. Daughter wishes her to be comfortable and doesnot want any aggressive treatment or rehospitalization. wishes to take her home with home hospice.  [facility] consulted.  Plan to discharge her home with Home hospice today.  Family Communication: daughter at bedside  Disposition Plan: home with hospice   Consultants:  Wound care nurse   Procedures:  none Antibiotics:  IV vanco (8/24-8/28)     Discharge Exam: Filed Vitals:   04/22/13 0634  BP: 130/65  Pulse: 85  Temp: 97.6 F (36.4 C)  Resp: 18    General: Cachectic elderly female . confused  Cardiovascular: NS1&S2, no murmurs  Respiratory: clear b/l  Abdomen: Soft, NT,ND, BS+,foley in place Skin: 3-4 cm sacral decubitus with purulent drainage, foul odor. bilateral trochanter decubiti. Right back with stage 2 pressure ulcer .severemuscle wasting.  CNS;AAOx1, demented   Discharge Instructions     Medication List    STOP taking these medications       amLODipine-benazepril 10-20 MG per capsule  Commonly known as:  LOTREL     nateglinide 60 MG tablet  Commonly known as:  STARLIX      TAKE these medications       aspirin 81 MG chewable tablet  Chew 81 mg by mouth daily.     bisacodyl 10 MG suppository  Commonly known as:  DULCOLAX  Place 1 suppository (10 mg total) rectally daily as needed for constipation.     busPIRone 15 MG tablet  Commonly known as:  BUSPAR  Take 15 mg by mouth 2  (two) times daily.     CALTRATE 600+D PLUS 600-400 MG-UNIT per tablet  Take 2 tablets by mouth daily.     diphenhydrAMINE 25 MG tablet  Commonly known as:  BENADRYL  Take 25 mg by mouth 2 (two) times daily as needed for itching or allergies.     feeding supplement Liqd  Take 237 mLs by mouth 2 (two) times daily between meals.     levothyroxine 50 MCG tablet  Commonly known as:  SYNTHROID, LEVOTHROID  Take 50 mcg by mouth daily.     memantine 10 MG tablet  Commonly known as:  NAMENDA  Take 5-10 mg by mouth See admin instructions. Takes 1 tablet in the morning and 5mg  every evening     morphine CONCENTRATE 10 mg / 0.5 ml concentrated solution  Take 0.25 mLs (5 mg total) by mouth every 3 (three) hours as needed for pain.     multivitamin with minerals Tabs tablet  Take 1 tablet by mouth daily.     sulfamethoxazole-trimethoprim 400-80 MG per tablet  Commonly known as:  BACTRIM,SEPTRA  Take 1 tablet by mouth every 12 (twelve) hours. ( until 05/02/2013)       No Known Allergies     Follow-up Information   Follow up with [facility] ([facility]). ([facility] to follow aftr d/c pls notify whn pt ready to leave floor call [phone] OR, if aftr 5 pm call [phone])    Contact information:   [facility] [address] [place], [place] [phone]       The results of significant diagnostics from this hospitalization (including imaging, microbiology, ancillary and laboratory) are listed below for reference.    Significant Diagnostic Studies: Dg Chest 2 View  04/17/2013   CLINICAL DATA:  Pain. Altered mental status.  EXAM: CHEST  2 VIEW  COMPARISON:  Radiographs 12/02/2011. CT 03/03/2013.  FINDINGS: There is a moderate thoracic kyphosis. The bones are diffusely demineralized. Thoracolumbar compression deformities and deformity of the sternum are grossly unchanged. The heart size and mediastinal contours are stable with diffuse thoracic aortic ectasia. The lungs appear  clear. There is no pleural effusion or pneumothorax. Advanced glenohumeral degenerative changes are present on the left.  IMPRESSION: No acute cardiopulmonary process identified.   Electronically Signed   By: Roxy Horseman   On: 04/17/2013 20:26   Dg Lumbar Spine Complete  04/17/2013   CLINICAL DATA:  Back pain.  Soft tissue ulcerations.  EXAM: LUMBAR SPINE - COMPLETE 4+ VIEW  COMPARISON:  CTs of the chest, abdomen and pelvis 03/03/2013.  FINDINGS: The bones are severely demineralized. There is a thoracolumbar kyphoscoliosis. Multiple compression deformities are grossly unchanged. Within the lumbar spine, these are demonstrated at L3, L4 and L5. No new fractures are identified. There is diffuse atherosclerosis.  IMPRESSION: No apparent change in multiple thoracolumbar compression deformities. Severe osteopenia.   Electronically Signed   By: Roxy Horseman   On: 04/17/2013 20:29    Microbiology: Recent Results (from the past 240 hour(s))  WOUND CULTURE     Status:  None   Collection Time    04/17/13 10:01 PM      Result Value Range Status   Specimen Description WOUND BUTTOCKS   Final   Special Requests NONE   Final   Gram Stain     Final   Value: MODERATE WBC PRESENT,BOTH PMN AND MONONUCLEAR     NO SQUAMOUS EPITHELIAL CELLS SEEN     MODERATE GRAM NEGATIVE RODS     FEW GRAM POSITIVE COCCI IN PAIRS     Performed at [facility]   Culture     Final   Value: RARE STAPHYLOCOCCUS AUREUS     Note: RIFAMPIN AND GENTAMICIN SHOULD NOT BE USED AS SINGLE DRUGS FOR TREATMENT OF STAPH INFECTIONS.     Performed at [facility]   Report Status 04/21/2013 FINAL   Final   Organism ID, Bacteria STAPHYLOCOCCUS AUREUS   Final  CULTURE, BLOOD (ROUTINE X 2)     Status: None   Collection Time    04/17/13 10:15 PM      Result Value Range Status   Specimen Description BLOOD LEFT HAND   Final   Special Requests BOTTLES DRAWN AEROBIC ONLY 5CC   Final   Culture  Setup Time     Final   Value:  04/18/2013 10:57     Performed at [facility]   Culture     Final   Value:        BLOOD CULTURE RECEIVED NO GROWTH TO DATE CULTURE WILL BE HELD FOR 5 DAYS BEFORE ISSUING A FINAL NEGATIVE REPORT     Performed at [facility]   Report Status PENDING   Incomplete  CULTURE, BLOOD (ROUTINE X 2)     Status: None   Collection Time    04/17/13 10:30 PM      Result Value Range Status   Specimen Description BLOOD LEFT HAND   Final   Special Requests BOTTLES DRAWN AEROBIC ONLY 1.5CC   Final   Culture  Setup Time     Final   Value: 04/18/2013 10:57     Performed at [facility]   Culture     Final   Value:        BLOOD CULTURE RECEIVED NO GROWTH TO DATE CULTURE WILL BE HELD FOR 5 DAYS BEFORE ISSUING A FINAL NEGATIVE REPORT     Performed at [facility]   Report Status PENDING   Incomplete  URINE CULTURE     Status: None   Collection Time    04/17/13 11:19 PM      Result Value Range Status   Specimen Description URINE, RANDOM   Final   Special Requests ADDED 2359   Final   Culture  Setup Time     Final   Value: 04/18/2013 00:34     Performed at [facility]   Colony Count     Final   Value: >=100,000 COLONIES/ML     Performed at [facility]   Culture     Final   Value: KLEBSIELLA OXYTOCA     Performed at [facility]   Report Status 04/19/2013 FINAL   Final   Organism ID, Bacteria KLEBSIELLA OXYTOCA   Final     Labs: Basic Metabolic Panel:  Recent Labs Lab 04/17/13 1907 04/18/13 0630 04/19/13 0700 04/21/13 0620 04/21/13 0800  NA 151* 153* 150* 140  --   K 3.6 2.6* 3.6 2.6*  --   CL 118* 121* 119*  109  --   CO2 22 24 22 21   --   GLUCOSE 165* 116* 99 122*  --   BUN 29* 20 16 11   --   CREATININE 0.57 0.49* 0.49* 0.45*  --   CALCIUM 11.0* 10.2 10.4 9.8  --   MG  --  2.1  --   --  1.9   Liver Function Tests:  Recent Labs Lab 04/17/13 1907 04/19/13 0700  AST 17 18  ALT 10 12  ALKPHOS 79 98  BILITOT 0.3 0.2*   PROT 6.1 5.8*  ALBUMIN 2.2* 1.9*   No results found for this basename: LIPASE, AMYLASE,  in the last 168 hours No results found for this basename: AMMONIA,  in the last 168 hours CBC:  Recent Labs Lab 04/17/13 1907 04/18/13 0630 04/19/13 0700 04/21/13 0620  WBC 19.4* 20.1* 16.2* 18.2*  NEUTROABS 17.2*  --   --   --   HGB 10.4* 9.0* 9.6* 9.5*  HCT 30.8* 27.4* 28.3* 27.9*  MCV 92.8 91.9 89.3 89.4  PLT 213 273 256 252   Cardiac Enzymes: No results found for this basename: CKTOTAL, CKMB, CKMBINDEX, TROPONINI,  in the last 168 hours BNP: BNP (last 3 results) No results found for this basename: PROBNP,  in the last 8760 hours CBG:  Recent Labs Lab 04/20/13 2111 04/21/13 0641 04/21/13 1113 04/21/13 1602 04/22/13 0653  GLUCAP 147* 136* 164* 162* 122*       Signed:  Raynee Mccasland  [facility] 04/22/2013, 10:23 AM

## 2013-04-24 LAB — CULTURE, BLOOD (ROUTINE X 2): Culture: NO GROWTH

## 2013-04-25 NOTE — Consult Note (Signed)
I have reviewed and discussed the care of this patient in detail with the nurse practitioner including pertinent patient records, physical exam findings and data. I agree with details of this encounter.  

## 2013-05-17 NOTE — Progress Notes (Signed)
Patient to discharge home with hospice. Clinical Social Worker will sign off for now as social work intervention is no longer needed. Please consult [place] again if new need arises.   Sabino Niemann, MSW, [employer] [phone]

## 2013-05-25 DEATH — deceased

## 2013-09-16 IMAGING — CR DG CHEST 2V
3 series · 3 of 3 positions shown · non-contrast
Comparison: None.

CLINICAL DATA: .  Weight loss.  Confusion.  Weakness.

CHEST - 2 VIEW

[w chest lat]
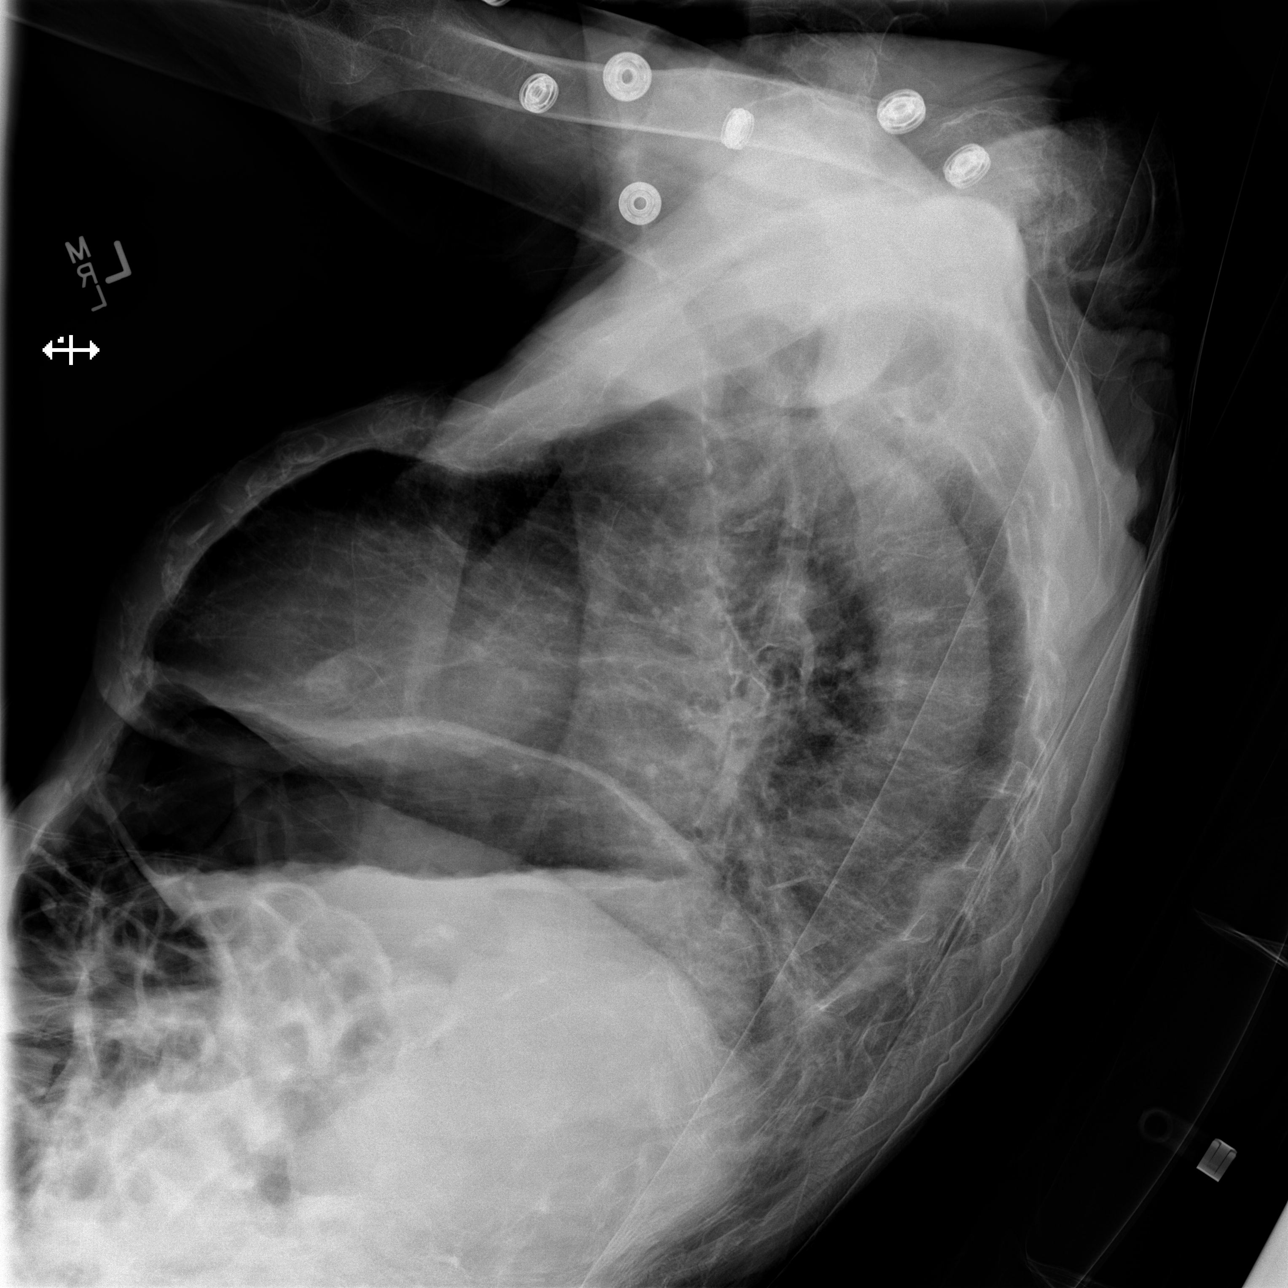

[x chest ap (1 of 2)]
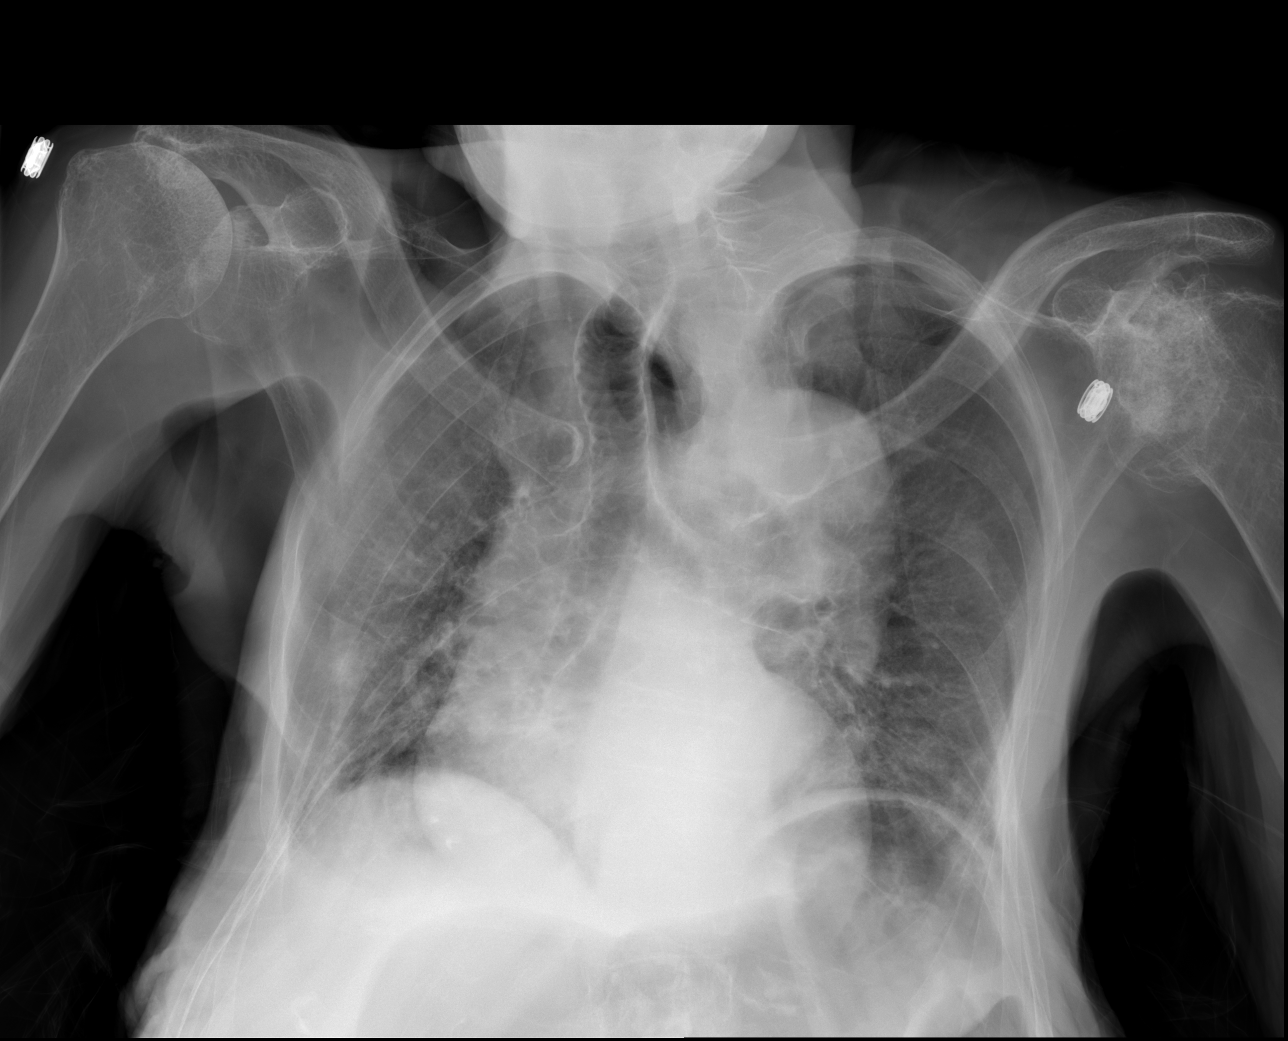

[x chest ap (2 of 2)]
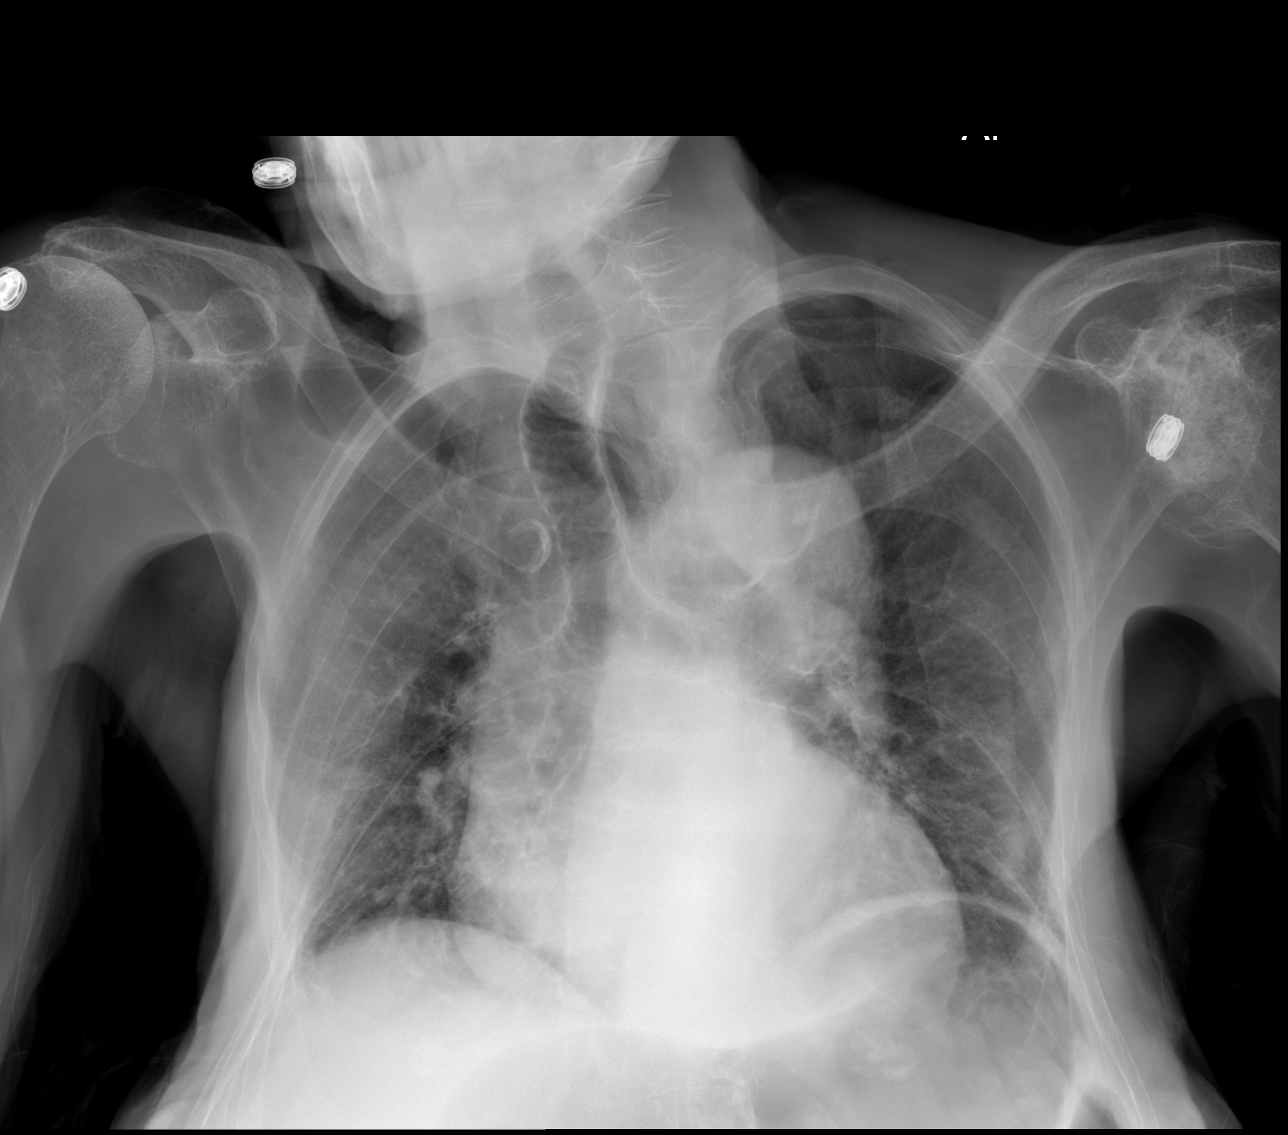

[3 of 3 positions shown; findings below may reference images not displayed]

FINDINGS: Lateral view degraded by patient positioning.  Severe
osteopenia.  Frontal views also degraded by patient positioning.
Severe degenerative or post-traumatic deformity of the left
glenohumeral joint.  Patient rotated to the right.  Moderate
cardiomegaly with tortuous thoracic aorta.  No definite pleural
fluid. No pneumothorax.  Bibasilar volume loss with interstitial
thickening. No lobar consolidation.
IMPRESSION: Although both views are degraded by patient positioning, no acute
finding. Cardiomegaly without congestive failure.

## 2014-12-17 IMAGING — CT CT ABD-PELV W/ CM
2 of 6 series · 15 of 36 positions shown, 18 images · IV contrast (APPLIED)
Comparison: Chest radiographs 12/02/2011.  Renal ultrasound
12/17/2011.

CT CHEST

CLINICAL DATA: Cough with abdominal pain and weight loss.
Question occult malignancy.

CT CHEST, ABDOMEN AND PELVIS WITH CONTRAST
TECHNIQUE: Multidetector CT imaging of the chest, abdomen and
pelvis was performed following the standard protocol during bolus
administration of intravenous contrast.
Contrast: 100mL OMNIPAQUE IOHEXOL 300 MG/ML  SOLN

[Series 4: thins · axial · 0.62mm/px · z∈[-556,-51]mm · 12 of 699 slices shown, 15 images]
[im 34/699  mediastinal]
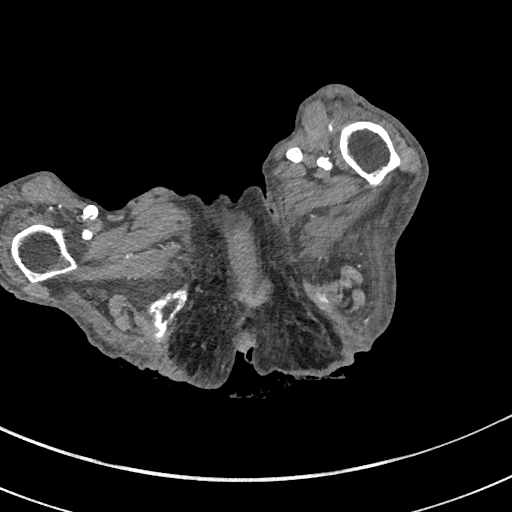
[im 34/699  lung]
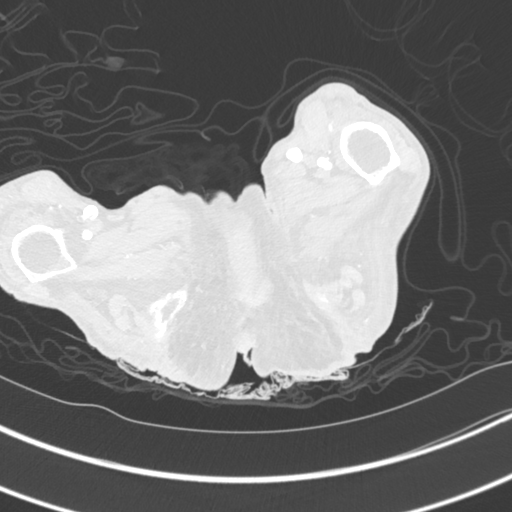
[im 100/699  lung]
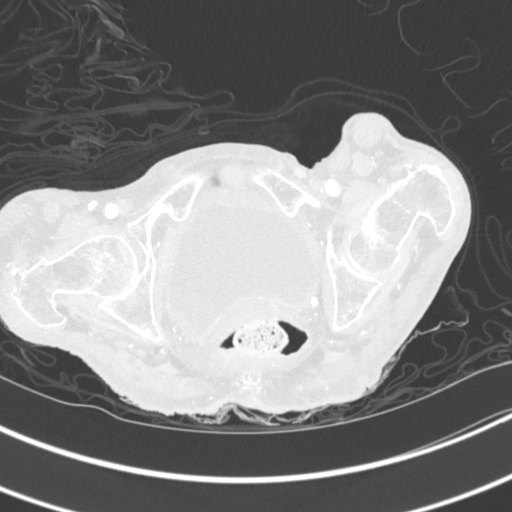
[im 167/699  lung]
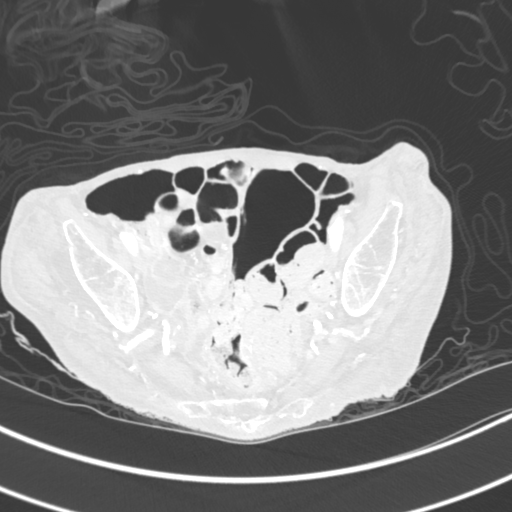
[im 200/699  lung]
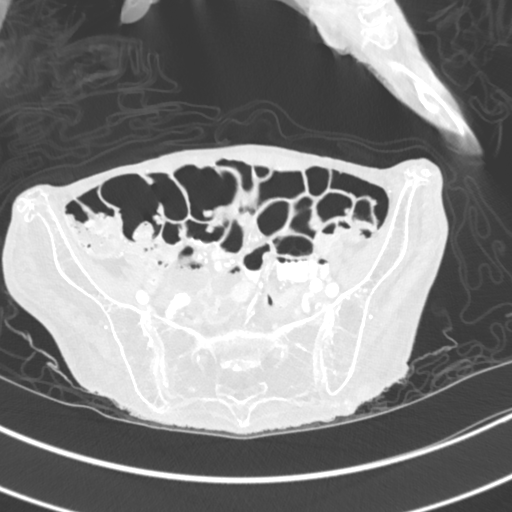
[im 266/699  mediastinal]
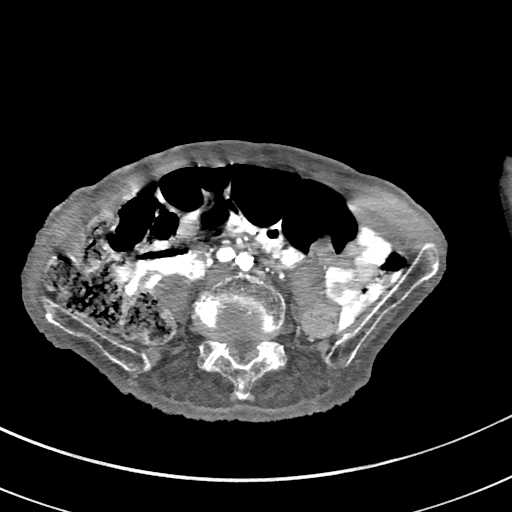
[im 266/699  lung]
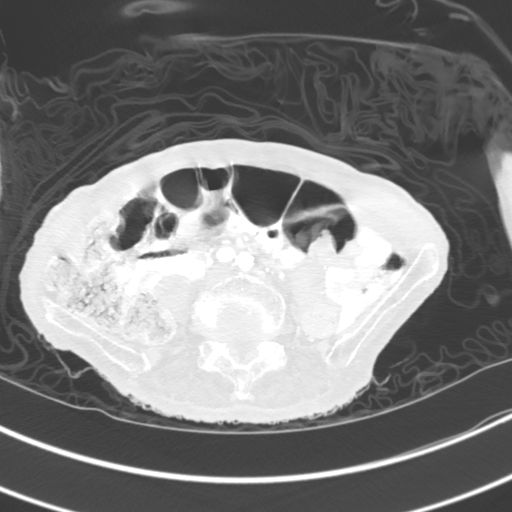
[im 333/699  lung]
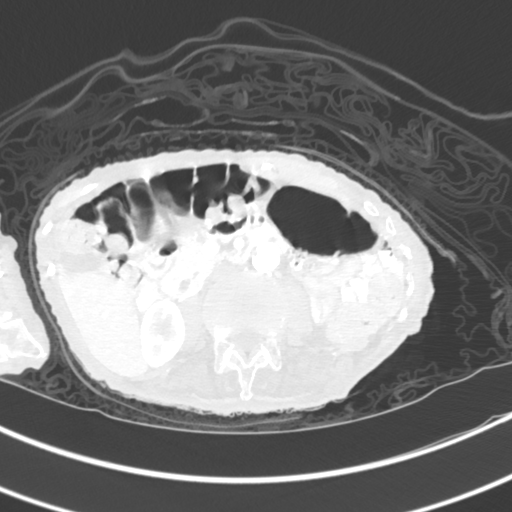
[im 366/699  lung]
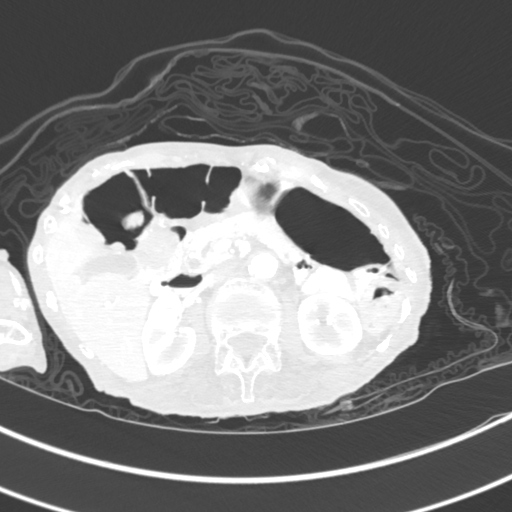
[im 433/699  lung]
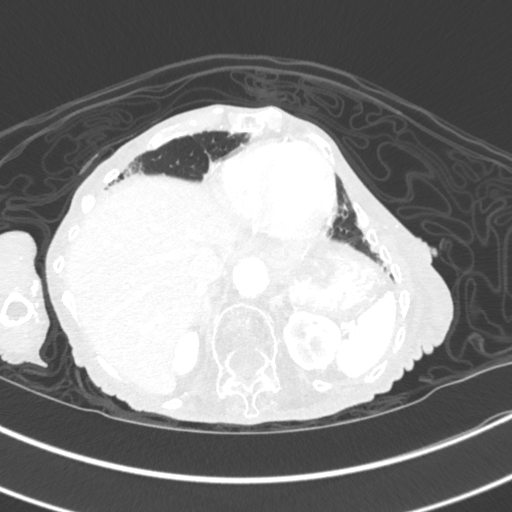
[im 499/699  mediastinal]
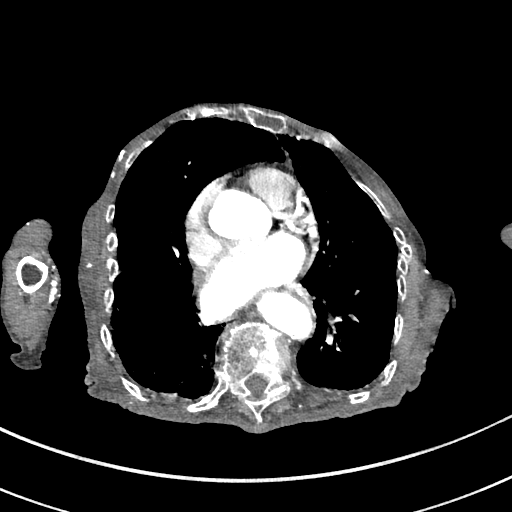
[im 499/699  lung]
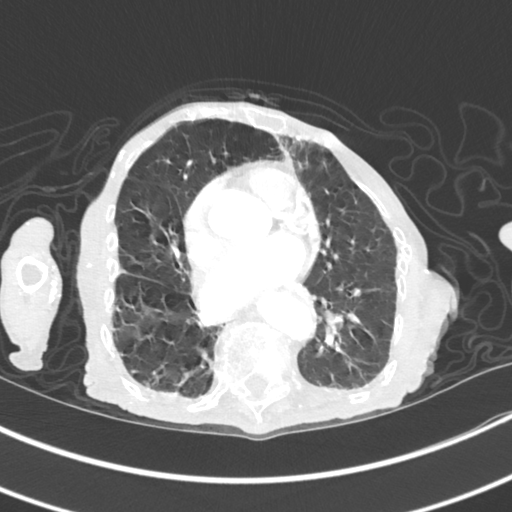
[im 532/699  lung]
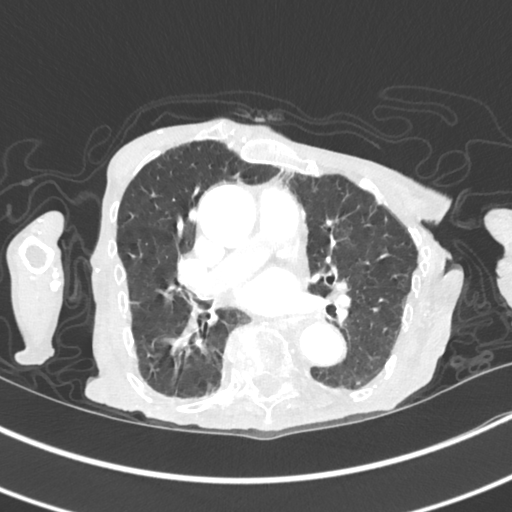
[im 599/699  lung]
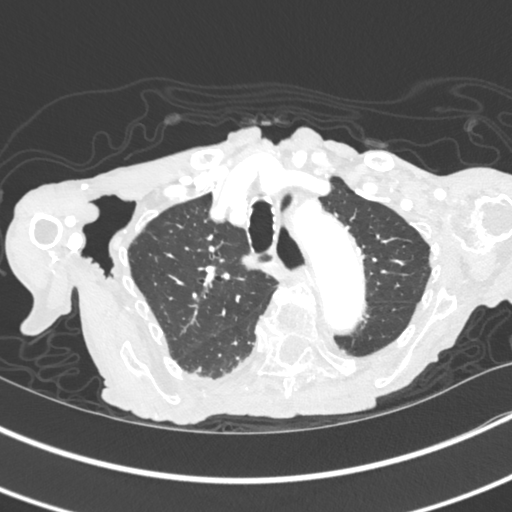
[im 665/699  lung]
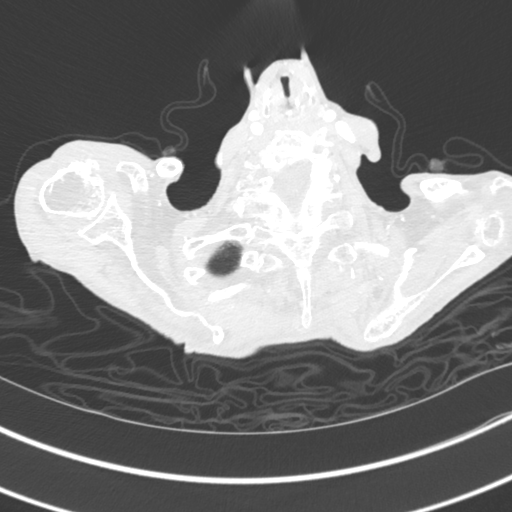

[Series 5: coronal · coronal · 1.09mm/px · 3 of 62 slices shown]
[im 13/62  lung]
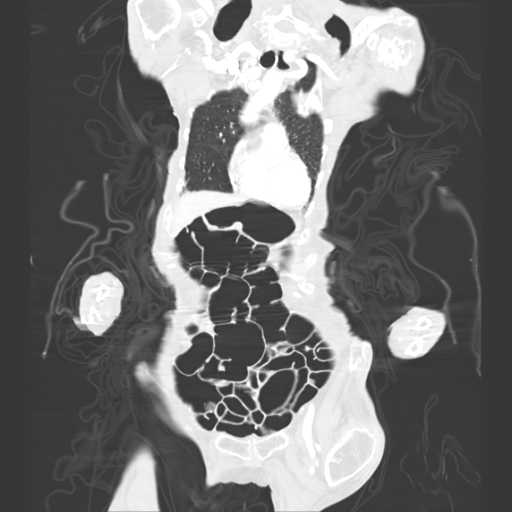
[im 25/62  lung]
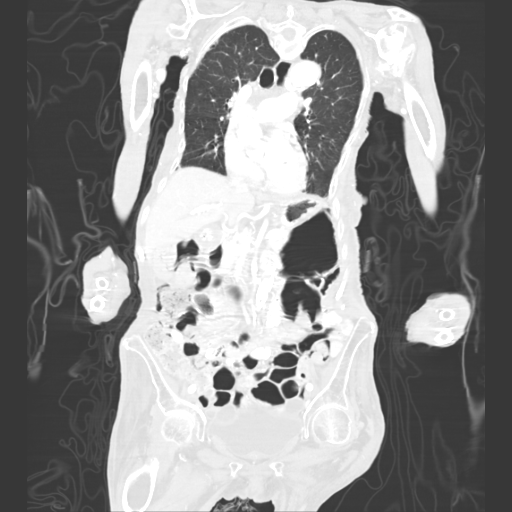
[im 37/62  lung]
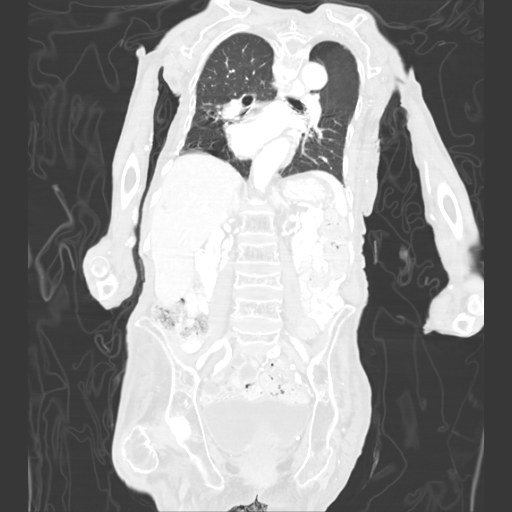

[15 of 36 positions shown; findings below may reference images not displayed]

FINDINGS: There is limited subcutaneous and mediastinal fat.  Mild
atherosclerosis of the aorta, great vessels and coronary arteries
is noted.  The pulmonary arteries appear normal.  There are no
enlarged mediastinal or hilar lymph nodes.

There is no pleural or pericardial effusion.  There is mild
scarring in both lungs, but no evidence of mass or infiltrate.
There is no endobronchial lesion.  There are age indeterminate
compression fractures at T3 and T12.
IMPRESSION: 1.  No evidence of thoracic malignancy or metastatic disease.
2.  Mild chronic lung disease and atherosclerosis.
3.  Thoracic compression deformities.

CT ABDOMEN AND PELVIS
FINDINGS: There is limited abdominal fat.  Small hepatic cysts are
noted, measuring up to 1.9 cm in the posterior segment of the right
hepatic lobe.  There is no evidence of enhancing hepatic lesion.
The gallbladder, biliary system, pancreas and spleen appear normal.

There is no adrenal mass.  There are small renal cortical and
parapelvic cysts bilaterally.  No enhancing renal masses are
identified.

The stomach and small bowel appear normal.  There is limited
enteric contrast within the colon which is filled with air and
stool.  No focal mucosal abnormalities are identified.  The bladder
appears normal.  The uterus is atrophied and retroverted.  There is
no adnexal mass.  There are no enlarged abdominal pelvic lymph
nodes.  Aorto iliac atherosclerosis is noted.

The bones are diffusely demineralized.  There are multiple lumbar
compression deformities without demonstrated acute features.
IMPRESSION: 1.  No evidence of abdominal pelvic malignancy or metastatic
disease.
2.  Atherosclerosis.
3.  Osteopenia with multiple lumbar compression deformities.
